# Patient Record
Sex: Female | Born: 1950 | Race: White | Hispanic: No | Marital: Married | Smoking: Former smoker
Health system: Southern US, Community
[De-identification: ages and names within clinical notes are randomized; demographics above are authoritative.]

## PROBLEM LIST (undated history)

## (undated) DIAGNOSIS — K635 Polyp of colon: Secondary | ICD-10-CM

## (undated) DIAGNOSIS — G4733 Obstructive sleep apnea (adult) (pediatric): Secondary | ICD-10-CM

## (undated) DIAGNOSIS — F32A Depression, unspecified: Secondary | ICD-10-CM

## (undated) DIAGNOSIS — Z8619 Personal history of other infectious and parasitic diseases: Secondary | ICD-10-CM

## (undated) DIAGNOSIS — M1990 Unspecified osteoarthritis, unspecified site: Secondary | ICD-10-CM

## (undated) DIAGNOSIS — C801 Malignant (primary) neoplasm, unspecified: Secondary | ICD-10-CM

## (undated) DIAGNOSIS — E785 Hyperlipidemia, unspecified: Secondary | ICD-10-CM

## (undated) DIAGNOSIS — R42 Dizziness and giddiness: Secondary | ICD-10-CM

## (undated) DIAGNOSIS — G4730 Sleep apnea, unspecified: Secondary | ICD-10-CM

## (undated) DIAGNOSIS — I10 Essential (primary) hypertension: Secondary | ICD-10-CM

## (undated) DIAGNOSIS — M4800 Spinal stenosis, site unspecified: Secondary | ICD-10-CM

## (undated) DIAGNOSIS — T7840XA Allergy, unspecified, initial encounter: Secondary | ICD-10-CM

## (undated) HISTORY — PX: TRIGGER FINGER RELEASE: SHX641

## (undated) HISTORY — DX: Allergy, unspecified, initial encounter: T78.40XA

## (undated) HISTORY — DX: Sleep apnea, unspecified: G47.30

## (undated) HISTORY — DX: Personal history of other infectious and parasitic diseases: Z86.19

## (undated) HISTORY — PX: TONSILLECTOMY: SUR1361

## (undated) HISTORY — DX: Hyperlipidemia, unspecified: E78.5

## (undated) HISTORY — PX: OVARIAN CYST REMOVAL: SHX89

## (undated) HISTORY — DX: Obstructive sleep apnea (adult) (pediatric): G47.33

## (undated) HISTORY — DX: Essential (primary) hypertension: I10

## (undated) HISTORY — DX: Polyp of colon: K63.5

## (undated) HISTORY — DX: Malignant (primary) neoplasm, unspecified: C80.1

## (undated) HISTORY — DX: Dizziness and giddiness: R42

## (undated) HISTORY — PX: COLONOSCOPY: SHX174

## (undated) HISTORY — PX: BREAST SURGERY: SHX581

## (undated) HISTORY — PX: OTHER SURGICAL HISTORY: SHX169

## (undated) HISTORY — PX: REDUCTION MAMMAPLASTY: SUR839

---

## 2001-07-17 HISTORY — PX: MASTECTOMY: SHX3

## 2001-07-17 HISTORY — PX: BREAST BIOPSY: SHX20

## 2012-01-31 DIAGNOSIS — E7800 Pure hypercholesterolemia, unspecified: Secondary | ICD-10-CM | POA: Insufficient documentation

## 2014-03-01 DIAGNOSIS — G4730 Sleep apnea, unspecified: Secondary | ICD-10-CM | POA: Insufficient documentation

## 2014-03-01 DIAGNOSIS — D126 Benign neoplasm of colon, unspecified: Secondary | ICD-10-CM | POA: Insufficient documentation

## 2014-03-01 DIAGNOSIS — G43909 Migraine, unspecified, not intractable, without status migrainosus: Secondary | ICD-10-CM | POA: Insufficient documentation

## 2014-03-01 DIAGNOSIS — R42 Dizziness and giddiness: Secondary | ICD-10-CM | POA: Insufficient documentation

## 2014-03-01 DIAGNOSIS — R7309 Other abnormal glucose: Secondary | ICD-10-CM | POA: Insufficient documentation

## 2014-03-01 DIAGNOSIS — J309 Allergic rhinitis, unspecified: Secondary | ICD-10-CM | POA: Insufficient documentation

## 2014-03-01 DIAGNOSIS — C50919 Malignant neoplasm of unspecified site of unspecified female breast: Secondary | ICD-10-CM | POA: Insufficient documentation

## 2014-03-01 DIAGNOSIS — G4733 Obstructive sleep apnea (adult) (pediatric): Secondary | ICD-10-CM | POA: Insufficient documentation

## 2014-03-01 DIAGNOSIS — G4700 Insomnia, unspecified: Secondary | ICD-10-CM | POA: Insufficient documentation

## 2014-03-01 DIAGNOSIS — D729 Disorder of white blood cells, unspecified: Secondary | ICD-10-CM | POA: Insufficient documentation

## 2014-11-19 DIAGNOSIS — Z803 Family history of malignant neoplasm of breast: Secondary | ICD-10-CM | POA: Insufficient documentation

## 2014-11-27 DIAGNOSIS — D0512 Intraductal carcinoma in situ of left breast: Secondary | ICD-10-CM | POA: Insufficient documentation

## 2015-06-09 LAB — HM COLONOSCOPY

## 2015-11-09 DIAGNOSIS — D126 Benign neoplasm of colon, unspecified: Secondary | ICD-10-CM | POA: Insufficient documentation

## 2016-03-02 LAB — HM PAP SMEAR: HM PAP: NORMAL

## 2017-09-17 DIAGNOSIS — F419 Anxiety disorder, unspecified: Secondary | ICD-10-CM | POA: Insufficient documentation

## 2017-09-17 DIAGNOSIS — F32A Depression, unspecified: Secondary | ICD-10-CM | POA: Insufficient documentation

## 2017-11-12 LAB — HM MAMMOGRAPHY

## 2018-02-25 DIAGNOSIS — L2084 Intrinsic (allergic) eczema: Secondary | ICD-10-CM | POA: Insufficient documentation

## 2018-05-28 ENCOUNTER — Ambulatory Visit (INDEPENDENT_AMBULATORY_CARE_PROVIDER_SITE_OTHER): Payer: BLUE CROSS/BLUE SHIELD | Admitting: Internal Medicine

## 2018-05-28 VITALS — BP 110/72 | HR 97 | Temp 98.4°F | Ht 64.0 in | Wt 197.6 lb

## 2018-05-28 DIAGNOSIS — Z86000 Personal history of in-situ neoplasm of breast: Secondary | ICD-10-CM

## 2018-05-28 DIAGNOSIS — G4733 Obstructive sleep apnea (adult) (pediatric): Secondary | ICD-10-CM

## 2018-05-28 DIAGNOSIS — R7303 Prediabetes: Secondary | ICD-10-CM | POA: Insufficient documentation

## 2018-05-28 DIAGNOSIS — E669 Obesity, unspecified: Secondary | ICD-10-CM

## 2018-05-28 DIAGNOSIS — Z1389 Encounter for screening for other disorder: Secondary | ICD-10-CM

## 2018-05-28 DIAGNOSIS — R42 Dizziness and giddiness: Secondary | ICD-10-CM

## 2018-05-28 DIAGNOSIS — E785 Hyperlipidemia, unspecified: Secondary | ICD-10-CM | POA: Diagnosis not present

## 2018-05-28 DIAGNOSIS — Z1159 Encounter for screening for other viral diseases: Secondary | ICD-10-CM

## 2018-05-28 DIAGNOSIS — Z13818 Encounter for screening for other digestive system disorders: Secondary | ICD-10-CM

## 2018-05-28 DIAGNOSIS — E559 Vitamin D deficiency, unspecified: Secondary | ICD-10-CM

## 2018-05-28 DIAGNOSIS — Z1329 Encounter for screening for other suspected endocrine disorder: Secondary | ICD-10-CM

## 2018-05-28 DIAGNOSIS — I10 Essential (primary) hypertension: Secondary | ICD-10-CM | POA: Insufficient documentation

## 2018-05-28 DIAGNOSIS — Z0184 Encounter for antibody response examination: Secondary | ICD-10-CM

## 2018-05-28 NOTE — Progress Notes (Signed)
Chief Complaint  Patient presents with  . Establish Care   New pt from [place]   1. 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 H/o OSA unable to tolerate cpap or dental piece in the past  3. H/o prediabetes A1C 5.9 8/14//19  4. HTN controlled/HLD TGs 165 02/27/18 on pravachol 10 mg qhs, lis-hctz 10-12.5 mg qd, norvasc 2.5 mg qd  5. H/o vertigo reviewed CT head and neck no sig CAS negative 02/15/18 takes prn meclizine CT head and neck neg 02/15/18    Review of Systems  Constitutional: Negative for weight loss.  HENT: Negative for hearing loss.   Eyes: Negative for blurred vision.  Respiratory: Negative for shortness of breath.   Cardiovascular: Negative for chest pain.  Gastrointestinal: Negative for abdominal pain.  Musculoskeletal: Negative for falls.  Skin: Negative for rash.  Neurological: Negative for dizziness.  Psychiatric/Behavioral: Negative for depression.   Past Medical History:  Diagnosis Date  . Allergy   . Cancer [facility])    left breast  DCIS cancer bx 2001/2002   . Colon polyps   . History of chicken pox   . Hyperlipidemia   . Hypertension   . OSA (obstructive sleep apnea)    not able to tolerate cpap or dental mouth piece   . Vertigo    CT neck and head negative significant CAS    Past Surgical History:  Procedure Laterality Date  . BREAST SURGERY     L mastectomy ~2001/2002 and reconstruction  . CESAREAN SECTION     x 3 1979, 1982, 1992   . tonsillectomy     Family History  Problem Relation Age of Onset  . Cancer Sister        breast cancer   . Cancer Cousin        m 1st cousin breast and pancreatic ca  . Cancer Cousin        breast m 1st cousin  . Cancer Cousin        m 1st cousin breast   . Cancer Cousin        m 1st cousin breast   . Cancer Mother        colon cancer    Social History   Socioeconomic History  . Marital status: Married    Spouse name: Not on file  . Number of children: Not on file  . Years of education: Not on file  . Highest education level: Not on file  Occupational History  . Not on file  Social Needs  . Financial resource strain: Not on file  . Food insecurity:    Worry: Not on file    Inability: Not on file  . Transportation needs:    Medical: Not on file    Non-medical: Not on file  Tobacco Use  . Smoking status: Former Research scientist (life sciences)  . Smokeless tobacco: Never Used  . Tobacco comment: former smoker x 3 years 19-22 light   Substance and Sexual Activity  . Alcohol use: Yes  . Drug use: Not Currently  . Sexual activity: Not Currently    Partners: Male  Lifestyle  . Physical activity:    Days per week: Not on file    Minutes per session: Not on file  . Stress: Not on file  Relationships  .  Social connections:    Talks on phone: Not on file    Gets together: Not on file    Attends religious service: Not on file    Active member of club or organization: Not on file    Attends meetings of clubs or organizations: Not on file    Relationship status: Not on file  . Intimate partner violence:    Fear of current or ex partner: Not on file    Emotionally abused: Not on file    Physically abused: Not on file    Forced sexual activity: Not on file  Other Topics Concern  . Not on file  Social History Narrative   Moved from [place] 2019    3 kids and grandkids    Statistician, retired    Married    No guns, wears seat belt safe in relationship    Current Meds  Medication Sig  . amLODipine (NORVASC) 2.5 MG tablet Take 2.5 mg by mouth daily.   Marland Kitchen aspirin 81 MG tablet Take 81 mg by mouth daily.  . chlorhexidine (PERIDEX) 0.12 % solution chlorhexidine gluconate 0.12 % mouthwash  . desloratadine (CLARINEX) 5 MG tablet desloratadine 5 mg tablet  . lisinopril-hydrochlorothiazide (PRINZIDE,ZESTORETIC) 10-12.5 MG tablet Take 2  tablets by mouth daily.   . meclizine (ANTIVERT) 25 MG tablet Take by mouth.  . meloxicam (MOBIC) 15 MG tablet meloxicam 15 mg tablet  . methocarbamol (ROBAXIN) 750 MG tablet methocarbamol 750 mg tablet  . Multiple Vitamin (MULTI-VITAMIN DAILY PO) Multi Vitamin  . Omega-3 Fatty Acids (FISH OIL PO) Take by mouth.  . pravastatin (PRAVACHOL) 10 MG tablet Take 10 mg by mouth at bedtime.   . triamcinolone cream (KENALOG) 0.1 % triamcinolone acetonide 0.1 % topical cream  . venlafaxine XR (EFFEXOR XR) 150 MG 24 hr capsule Take 150 mg by mouth daily with breakfast.    No Known Allergies No results found for this or any previous visit (from the past 2160 hour(s)). Objective  Body mass index is 33.92 kg/m. Wt Readings from Last 3 Encounters:  05/28/18 197 lb 9.6 oz (89.6 kg)   Temp Readings from Last 3 Encounters:  05/28/18 98.4 F (36.9 C)   BP Readings from Last 3 Encounters:  05/28/18 110/72   Pulse Readings from Last 3 Encounters:  05/28/18 97    Physical Exam  Constitutional: She is oriented to person, place, and time. Vital signs are normal. She appears well-developed and well-nourished. She is cooperative.  HENT:  Head: Normocephalic and atraumatic.  Mouth/Throat: Oropharynx is clear and moist and mucous membranes are normal.  Eyes: Pupils are equal, round, and reactive to light. Conjunctivae are normal.  Cardiovascular: Normal rate, regular rhythm and normal heart sounds.  Pulmonary/Chest: Effort normal and breath sounds normal.  Neurological: She is alert and oriented to person, place, and time. Gait normal.  Skin: Skin is warm, dry and intact.  Psychiatric: She has a normal mood and affect. Her speech is normal and behavior is normal. Judgment and thought content normal. Cognition and memory are normal.  Nursing note and vitals reviewed.   Assessment   1. 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 H/o OSA unable to tolerate cpap or dental piece  3. H/o prediabetes  4. HTN controlled/HLD TGs 165 02/27/18  5. H/o vertigo reviewed CT head and neck no sig CAS negative  02/15/18  6. HM/BMI 33.92  Plan   1. Pt will f/u with breast surgeon in [place]  2. Monitor  3. Check labs upcoming A1C was 5.9 02/15/18  4. Cont meds pravachol 10 mg qhs, lis-hctz 10-12.5 mg qd, norvasc 2.5 mg qd  Will want refill to [facility] in 06/2018 insurance changes of all meds pharmacy for now [facility]  5. Prn meclizine  6.  Flu shot had 03/14/18 Consider prevnar and pna 23 in future  Tdap had 09/05/10 ? If had in 2017 as well confirm with pt  shingrix 1/2 had 03/2018 2nd dose due 06/03/18 or before 6 months of 1st   Had labs CBC, A1C, lipid 02/2018 reviewed cbc normal A1C 5.9 Tgs 165 elevated.  mammo had 11/12/17 unilateral right 3d neg fibrograndular densities, 08/08/17 breast MRI benign Will need ob/gyn in 02/2019 disc Dr. Blima Rich [facility]  Colonoscopy 05/31/15 sessile serrated polyp per pt per GI f/u in 5 years. Mom + FH colon cancer  DEXA and pap requested Dr. Rexene Alberts [facility] [place] Derm appt 06/2018 Dr. Evorn Gong   PCP Lynnell Grain in 2019 in [place]  Breast cancer surgeon Dr. Real Cons in [place] wants to continue to follow  OB/GYN Dr. Rexene Alberts in [place]     Provider: Dr. Olivia Mackie McLean-Scocuzza-Internal Medicine

## 2018-05-28 NOTE — Patient Instructions (Addendum)
Dr. Blima Rich [facility] Ob[facility])-call me back when ready like 2-3 months before 02/2019  Dr. Georgianne Fick ([facility])  Call me back around and request [facility]2018    Cholesterol Cholesterol is a white, waxy, fat-like substance that is needed by the human body in small amounts. The liver makes all the cholesterol we need. Cholesterol is carried from the liver by the blood through the blood vessels. Deposits of cholesterol (plaques) may build up on blood vessel (artery) walls. Plaques make the arteries narrower and stiffer. Cholesterol plaques increase the risk for heart attack and stroke. You cannot feel your cholesterol level even if it is very high. The only way to know that it is high is to have a blood test. Once you know your cholesterol levels, you should keep a record of the test results. Work with your health care provider to keep your levels in the desired range. What do the results mean?  Total cholesterol is a rough measure of all the cholesterol in your blood.  LDL (low-density lipoprotein) is the "bad" cholesterol. This is the type that causes plaque to build up on the artery walls. You want this level to be low.  HDL (high-density lipoprotein) is the "good" cholesterol because it cleans the arteries and carries the LDL away. You want this level to be high.  Triglycerides are fat that the body can either burn for energy or store. High levels are closely linked to heart disease. What are the desired levels of cholesterol?  Total cholesterol below 200.  LDL below 100 for people who are at risk, below 70 for people at very high risk.  HDL above 40 is good. A level of 60 or higher is considered to be protective against heart disease.  Triglycerides below 150. How can I lower my cholesterol? Diet Follow your diet program as told by your health care provider.  Choose fish or white meat chicken and [place], roasted or baked. Limit  fatty cuts of red meat, fried foods, and processed meats, such as sausage and lunch meats.  Eat lots of fresh fruits and vegetables.  Choose whole grains, beans, pasta, potatoes, and cereals.  Choose olive oil, corn oil, or canola oil, and use only small amounts.  Avoid butter, mayonnaise, shortening, or palm kernel oils.  Avoid foods with trans fats.  Drink skim or nonfat milk and eat low-fat or nonfat yogurt and cheeses. Avoid whole milk, cream, ice cream, egg yolks, and full-fat cheeses.  Healthier desserts include angel food cake, ginger snaps, animal crackers, hard candy, popsicles, and low-fat or nonfat frozen yogurt. Avoid pastries, cakes, pies, and cookies.  Exercise  Follow your exercise program as told by your health care provider. A regular program: ? Helps to decrease LDL and raise HDL. ? Helps with weight control.  Do things that increase your activity level, such as gardening, walking, and taking the stairs.  Ask your health care provider about ways that you can be more active in your daily life.  Medicine  Take over-the-counter and prescription medicines only as told by your health care provider. ? Medicine may be prescribed by your health care provider to help lower cholesterol and decrease the risk for heart disease. This is usually done if diet and exercise have failed to bring down cholesterol levels. ? If you have several risk factors, you may need medicine even if your levels are normal.  This information is not intended to replace advice given to you  by your health care provider. Make sure you discuss any questions you have with your health care provider. Document Released: 03/28/2001 Document Revised: 01/29/2016 Document Reviewed: 01/01/2016 Elsevier Interactive Patient Education  [employer].

## 2018-05-28 NOTE — Progress Notes (Signed)
Pre visit review using our clinic review tool, if applicable. No additional management support is needed unless otherwise documented below in the visit note. 

## 2018-05-29 ENCOUNTER — Encounter: Payer: Self-pay | Admitting: Internal Medicine

## 2018-05-29 DIAGNOSIS — E669 Obesity, unspecified: Secondary | ICD-10-CM | POA: Insufficient documentation

## 2018-06-03 ENCOUNTER — Other Ambulatory Visit: Payer: BLUE CROSS/BLUE SHIELD

## 2018-06-05 ENCOUNTER — Other Ambulatory Visit (INDEPENDENT_AMBULATORY_CARE_PROVIDER_SITE_OTHER): Payer: BLUE CROSS/BLUE SHIELD

## 2018-06-05 ENCOUNTER — Ambulatory Visit (INDEPENDENT_AMBULATORY_CARE_PROVIDER_SITE_OTHER): Payer: BLUE CROSS/BLUE SHIELD | Admitting: *Deleted

## 2018-06-05 DIAGNOSIS — Z13818 Encounter for screening for other digestive system disorders: Secondary | ICD-10-CM

## 2018-06-05 DIAGNOSIS — Z1329 Encounter for screening for other suspected endocrine disorder: Secondary | ICD-10-CM | POA: Diagnosis not present

## 2018-06-05 DIAGNOSIS — E559 Vitamin D deficiency, unspecified: Secondary | ICD-10-CM

## 2018-06-05 DIAGNOSIS — I10 Essential (primary) hypertension: Secondary | ICD-10-CM

## 2018-06-05 DIAGNOSIS — Z1159 Encounter for screening for other viral diseases: Secondary | ICD-10-CM | POA: Diagnosis not present

## 2018-06-05 DIAGNOSIS — Z0184 Encounter for antibody response examination: Secondary | ICD-10-CM

## 2018-06-05 DIAGNOSIS — Z23 Encounter for immunization: Secondary | ICD-10-CM | POA: Diagnosis not present

## 2018-06-05 DIAGNOSIS — Z1389 Encounter for screening for other disorder: Secondary | ICD-10-CM

## 2018-06-05 LAB — COMPREHENSIVE METABOLIC PANEL
ALT: 25 U/L (ref 0–35)
AST: 21 U/L (ref 0–37)
Albumin: 4.3 g/dL (ref 3.5–5.2)
Alkaline Phosphatase: 54 U/L (ref 39–117)
BUN: 21 mg/dL (ref 6–23)
CHLORIDE: 101 meq/L (ref 96–112)
CO2: 30 meq/L (ref 19–32)
CREATININE: 0.82 mg/dL (ref 0.40–1.20)
Calcium: 9.8 mg/dL (ref 8.4–10.5)
GFR: 74.37 mL/min (ref >60.00)
GLUCOSE: 89 mg/dL (ref 70–99)
Potassium: 3.8 mEq/L (ref 3.5–5.1)
Sodium: 140 mEq/L (ref 135–145)
Total Bilirubin: 0.6 mg/dL (ref 0.2–1.2)
Total Protein: 7.1 g/dL (ref 6.0–8.3)

## 2018-06-05 LAB — VITAMIN D 25 HYDROXY (VIT D DEFICIENCY, FRACTURES): VITD: 24.5 ng/mL — ABNORMAL LOW (ref 30.00–100.00)

## 2018-06-05 LAB — TSH: TSH: 2.25 u[IU]/mL (ref 0.35–4.50)

## 2018-06-05 NOTE — Addendum Note (Signed)
Addended by: Leeanne Rio on: 06/05/2018 10:57 AM   Modules accepted: Orders

## 2018-06-06 LAB — URINALYSIS, ROUTINE W REFLEX MICROSCOPIC
BILIRUBIN UA: NEGATIVE
GLUCOSE, UA: NEGATIVE
NITRITE UA: NEGATIVE
PROTEIN UA: NEGATIVE
RBC UA: NEGATIVE
Specific Gravity, UA: 1.029 (ref 1.005–1.030)
Urobilinogen, Ur: 0.2 mg/dL (ref 0.2–1.0)
pH, UA: 5 (ref 5.0–7.5)

## 2018-06-06 LAB — MEASLES/MUMPS/RUBELLA IMMUNITY: Rubella: 15.3 index

## 2018-06-06 LAB — MICROSCOPIC EXAMINATION
Casts: NONE SEEN /lpf
Epithelial Cells (non renal): NONE SEEN /hpf (ref 0–10)

## 2018-06-06 LAB — HEPATITIS C ANTIBODY
HEP C AB: NONREACTIVE
SIGNAL TO CUT-OFF: 0.01 (ref <1.00)

## 2018-06-07 ENCOUNTER — Encounter: Payer: Self-pay | Admitting: Internal Medicine

## 2018-07-01 ENCOUNTER — Encounter: Payer: Self-pay | Admitting: Internal Medicine

## 2018-07-01 MED ORDER — LISINOPRIL-HYDROCHLOROTHIAZIDE 10-12.5 MG PO TABS: 2.0000 | ORAL_TABLET | Freq: Every day | ORAL | 1 refills | Status: DC

## 2018-07-02 ENCOUNTER — Other Ambulatory Visit: Payer: Self-pay | Admitting: Internal Medicine

## 2018-07-02 DIAGNOSIS — I10 Essential (primary) hypertension: Secondary | ICD-10-CM

## 2018-07-02 DIAGNOSIS — E785 Hyperlipidemia, unspecified: Secondary | ICD-10-CM

## 2018-07-02 MED ORDER — AMLODIPINE BESYLATE 2.5 MG PO TABS: 2.5000 mg | ORAL_TABLET | Freq: Every day | ORAL | 3 refills | Status: DC

## 2018-07-02 MED ORDER — PRAVASTATIN SODIUM 40 MG PO TABS: 40.0000 mg | ORAL_TABLET | Freq: Every day | ORAL | 3 refills | Status: DC

## 2018-07-15 ENCOUNTER — Telehealth: Payer: Self-pay | Admitting: Internal Medicine

## 2018-07-15 NOTE — Telephone Encounter (Signed)
Pt is having a MRI done on 08/08/17 in [place]. And needs to have A BUN and Creatine lab done while she is here  Please place lab orders

## 2018-07-15 NOTE — Telephone Encounter (Signed)
PCP nor Morgan ordered MRI. Provider who ordered the MRI will have to place order, Correct?

## 2018-07-15 NOTE — Telephone Encounter (Signed)
I will defer to the patients PCP.

## 2018-07-16 NOTE — Telephone Encounter (Signed)
Yes who ordered MRI?  Call and ask pt who ever ordered MRI will need to put these labs in Thanks [place]rtle

## 2018-07-16 NOTE — Telephone Encounter (Signed)
CT doctor sent in MRI.  Patient needs labs done a month before but she will not be in [place] until middle of January.  She is requesting the labs done here in [place] and results she will take back with her in [place], where she will have her MRI.

## 2018-07-16 NOTE — Telephone Encounter (Signed)
She can call the doctor who ordered CT/MRI to get lab order for [facility] order for [facility] and have then faxed back to ordering provider. We have several [facility] in [place]  I do not want to start ordering labs for another physician   [place]

## 2018-07-19 ENCOUNTER — Encounter: Payer: Self-pay | Admitting: Internal Medicine

## 2018-07-19 NOTE — Telephone Encounter (Signed)
Patient was informed.

## 2018-07-29 ENCOUNTER — Encounter: Payer: Self-pay | Admitting: Internal Medicine

## 2018-07-31 ENCOUNTER — Other Ambulatory Visit: Payer: Self-pay | Admitting: Internal Medicine

## 2018-07-31 ENCOUNTER — Encounter: Payer: Self-pay | Admitting: Internal Medicine

## 2018-07-31 DIAGNOSIS — I10 Essential (primary) hypertension: Secondary | ICD-10-CM

## 2018-07-31 DIAGNOSIS — F419 Anxiety disorder, unspecified: Secondary | ICD-10-CM | POA: Insufficient documentation

## 2018-07-31 DIAGNOSIS — R232 Flushing: Secondary | ICD-10-CM

## 2018-07-31 MED ORDER — LISINOPRIL-HYDROCHLOROTHIAZIDE 20-12.5 MG PO TABS: 1.0000 | ORAL_TABLET | Freq: Every day | ORAL | 3 refills | Status: DC

## 2018-07-31 MED ORDER — VENLAFAXINE HCL ER 150 MG PO CP24: 150.0000 mg | ORAL_CAPSULE | Freq: Every day | ORAL | 3 refills | Status: DC

## 2018-08-23 ENCOUNTER — Ambulatory Visit: Payer: Medicare Other | Admitting: Internal Medicine

## 2018-08-23 ENCOUNTER — Encounter: Payer: Self-pay | Admitting: Internal Medicine

## 2018-08-23 DIAGNOSIS — J101 Influenza due to other identified influenza virus with other respiratory manifestations: Secondary | ICD-10-CM | POA: Diagnosis not present

## 2018-08-23 MED ORDER — GUAIFENESIN-CODEINE 100-10 MG/5ML PO SYRP: 5.0000 mL | ORAL_SOLUTION | Freq: Three times a day (TID) | ORAL | 0 refills | Status: DC | PRN

## 2018-08-23 MED ORDER — PREDNISONE 10 MG PO TABS: ORAL_TABLET | ORAL | 0 refills | Status: DC

## 2018-08-23 MED ORDER — OSELTAMIVIR PHOSPHATE 75 MG PO CAPS: 75.0000 mg | ORAL_CAPSULE | Freq: Two times a day (BID) | ORAL | 0 refills | Status: DC

## 2018-08-23 NOTE — Progress Notes (Signed)
Subjective:  Patient ID: Tiffany Jarvis, female    DOB: 24-Feb-1951  Age: 68 y.o. MRN: [id number]  CC: The encounter diagnosis was Influenza due to influenza virus, type B.  HPI Tiffany Jarvis presents for evaluation and treatment of flu like illness.  Symptoms started 3 days ago with a mild sore throat and rhinorrhea,  followed by the development of body aches,sinus congestion ,  productive cough and fevers yesterday,  Accompanied by anorexia     She denies sinus/facial/ear pain.  No nausea, vomiting or diarrhea. Has mild headache without  neck pain .    Outpatient Medications Prior to Visit  Medication Sig Dispense Refill  . amLODipine (NORVASC) 2.5 MG tablet Take 1 tablet (2.5 mg total) by mouth daily. 90 tablet 3  . aspirin 81 MG tablet Take 81 mg by mouth daily.    . chlorhexidine (PERIDEX) 0.12 % solution chlorhexidine gluconate 0.12 % mouthwash    . desloratadine (CLARINEX) 5 MG tablet desloratadine 5 mg tablet    . lisinopril-hydrochlorothiazide (ZESTORETIC) 20-12.5 MG tablet Take 1 tablet by mouth daily. In am 90 tablet 3  . meclizine (ANTIVERT) 25 MG tablet Take by mouth.    . meloxicam (MOBIC) 15 MG tablet meloxicam 15 mg tablet    . methocarbamol (ROBAXIN) 750 MG tablet methocarbamol 750 mg tablet    . Multiple Vitamin (MULTI-VITAMIN DAILY PO) Multi Vitamin    . Omega-3 Fatty Acids (FISH OIL PO) Take by mouth.    . pravastatin (PRAVACHOL) 40 MG tablet Take 1 tablet (40 mg total) by mouth at bedtime. 90 tablet 3  . triamcinolone cream (KENALOG) 0.1 % triamcinolone acetonide 0.1 % topical cream    . venlafaxine XR (EFFEXOR XR) 150 MG 24 hr capsule Take 1 capsule (150 mg total) by mouth daily with breakfast. 90 capsule 3   No facility-administered medications prior to visit.     Review of Systems;  Patient denies , unintentional weight loss, skin rash, eye pain, sinus congestion and sinus pain,  dysphagia,  hemoptysis ,  dyspnea, wheezing, chest pain, palpitations, orthopnea,  edema, abdominal pain, nausea, melena, diarrhea, constipation, flank pain, dysuria, hematuria, urinary  Frequency, nocturia, numbness, tingling, seizures,  Focal weakness, Loss of consciousness,  Tremor, insomnia, depression, anxiety, and suicidal ideation.      Objective:  BP 118/84 (BP Location: Left Arm, Patient Position: Sitting, Cuff Size: Large)   Pulse 96   Temp 98.6 F (37 C) (Oral)   Resp 16   Ht 5\' 4"  (1.626 m)   Wt 198 lb 9.6 oz (90.1 kg)   SpO2 96%   BMI 34.09 kg/m   BP Readings from Last 3 Encounters:  08/23/18 118/84  05/28/18 110/72    Wt Readings from Last 3 Encounters:  08/23/18 198 lb 9.6 oz (90.1 kg)  05/28/18 197 lb 9.6 oz (89.6 kg)    General appearance: alert, cooperative and appears ill/ stated age Ears: normal TM's and external ear canals both ears Throat: lips, mucosa, and tongue normal; teeth and gums normal Neck: tender cervical  adenopathy, no carotid bruit, supple, symmetrical, trachea midlineand thyroid not enlar ged, symmetric, no tenderness/mass/nodules Back: symmetric, no curvature. ROM normal. No CVA tenderness. Lungs: clear to auscultation bilaterally Heart: regular rate and rhythm, S1, S2 normal, no murmur, click, rub or gallop Abdomen: soft, non-tender; bowel sounds normal; no masses,  no organomegaly Pulses: 2+ and symmetric Skin: Skin color, texture, turgor normal. No rashes or lesions Lymph nodes: Cervical, supraclavicular, and axillary nodes normal.  No results found for: HGBA1C  Lab Results  Component Value Date   CREATININE 0.82 06/05/2018    Lab Results  Component Value Date   GLUCOSE 89 06/05/2018   ALT 25 06/05/2018   AST 21 06/05/2018   NA 140 06/05/2018   K 3.8 06/05/2018   CL 101 06/05/2018   CREATININE 0.82 06/05/2018   BUN 21 06/05/2018   CO2 30 06/05/2018   TSH 2.25 06/05/2018    Patient was never admitted.  Assessment & Plan:   Problem List Items Addressed This Visit    Influenza due to influenza  virus, type B    Prescribing  tamiflu  To start  Tonight,  Continue tylenol and motrin for body aches.  Adding Prednisone taper and cough suppressant  manage cough and sinus congestion .       Relevant Medications   oseltamivir (TAMIFLU) 75 MG capsule      I am having [name] start on oseltamivir, guaiFENesin-codeine, and predniSONE. I am also having her maintain her Multiple Vitamin (MULTI-VITAMIN DAILY PO), chlorhexidine, desloratadine, meclizine, meloxicam, methocarbamol, triamcinolone cream, aspirin, Omega-3 Fatty Acids (FISH OIL PO), amLODipine, pravastatin, lisinopril-hydrochlorothiazide, and venlafaxine XR.  Meds ordered this encounter  Medications  . oseltamivir (TAMIFLU) 75 MG capsule    Sig: Take 1 capsule (75 mg total) by mouth 2 (two) times daily.    Dispense:  10 capsule    Refill:  0  . guaiFENesin-codeine (CHERATUSSIN AC) 100-10 MG/5ML syrup    Sig: Take 5 mLs by mouth 3 (three) times daily as needed for cough.    Dispense:  120 mL    Refill:  0  . predniSONE (DELTASONE) 10 MG tablet    Sig: 6 tablets on Day 1 , then reduce by 1 tablet daily until gone    Dispense:  21 tablet    Refill:  0    There are no discontinued medications.  Follow-up: No follow-ups on file.   Crecencio Mc, MD

## 2018-08-23 NOTE — Patient Instructions (Addendum)
YOU HAVE INFLUENZA B  I recommend that you start taking Tamiflu tonight and continue it twice daily for 5 days  I am also prescribing a prednisone taper to help the cough and body aches:  6 tablets all at once IN THE MORNING  on Day 1,  Then 5 tablets on Day 2,  4 tablets on Day 3,  Etc ( continue to reduce dose by  1 tablet daily until gone)  cheratussin with codeine for severe cough  You can still use tylenol up to 2000 mg daily for body aches and fever   Rest,  Fluids,  Rest fluids!!    Influenza, Adult Influenza, more commonly known as "the flu," is a viral infection that mainly affects the respiratory tract. The respiratory tract includes organs that help you breathe, such as the lungs, nose, and throat. The flu causes many symptoms similar to the common cold along with high fever and body aches. The flu spreads easily from person to person (is contagious). Getting a flu shot (influenza vaccination) every year is the best way to prevent the flu. What are the causes? This condition is caused by the influenza virus. You can get the virus by:  Breathing in droplets that are in the air from an infected person's cough or sneeze.  Touching something that has been exposed to the virus (has been contaminated) and then touching your mouth, nose, or eyes. What increases the risk? The following factors may make you more likely to get the flu:  Not washing or sanitizing your hands often.  Having close contact with many people during cold and flu season.  Touching your mouth, eyes, or nose without first washing or sanitizing your hands.  Not getting a yearly (annual) flu shot. You may have a higher risk for the flu, including serious problems such as a lung infection (pneumonia), if you:  Are older than 65.  Are pregnant.  Have a weakened disease-fighting system (immune system). You may have a weakened immune system if you: ? Have HIV or AIDS. ? Are undergoing chemotherapy. ? Are  taking medicines that reduce (suppress) the activity of your immune system.  Have a long-term (chronic) illness, such as heart disease, kidney disease, diabetes, or lung disease.  Have a liver disorder.  Are severely overweight (morbidly obese).  Have anemia. This is a condition that affects your red blood cells.  Have asthma. What are the signs or symptoms? Symptoms of this condition usually begin suddenly and last 4-14 days. They may include:  Fever and chills.  Headaches, body aches, or muscle aches.  Sore throat.  Cough.  Runny or stuffy (congested) nose.  Chest discomfort.  Poor appetite.  Weakness or fatigue.  Dizziness.  Nausea or vomiting. How is this diagnosed? This condition may be diagnosed based on:  Your symptoms and medical history.  A physical exam.  Swabbing your nose or throat and testing the fluid for the influenza virus. How is this treated? If the flu is diagnosed early, you can be treated with medicine that can help reduce how severe the illness is and how long it lasts (antiviral medicine). This may be given by mouth (orally) or through an IV. Taking care of yourself at home can help relieve symptoms. Your health care provider may recommend:  Taking over-the-counter medicines.  Drinking plenty of fluids. In many cases, the flu goes away on its own. If you have severe symptoms or complications, you may be treated in a hospital. Follow these instructions  at home: Activity  Rest as needed and get plenty of sleep.  Stay home from work or school as told by your health care provider. Unless you are visiting your health care provider, avoid leaving home until your fever has been gone for 24 hours without taking medicine. Eating and drinking  Take an oral rehydration solution (ORS). This is a drink that is sold at pharmacies and retail stores.  Drink enough fluid to keep your urine pale yellow.  Drink clear fluids in small amounts as you are  able. Clear fluids include water, ice chips, diluted fruit juice, and low-calorie sports drinks.  Eat bland, easy-to-digest foods in small amounts as you are able. These foods include bananas, applesauce, rice, lean meats, toast, and crackers.  Avoid drinking fluids that contain a lot of sugar or caffeine, such as energy drinks, regular sports drinks, and soda.  Avoid alcohol.  Avoid spicy or fatty foods. General instructions      Take over-the-counter and prescription medicines only as told by your health care provider.  Use a cool mist humidifier to add humidity to the air in your home. This can make it easier to breathe.  Cover your mouth and nose when you cough or sneeze.  Wash your hands with soap and water often, especially after you cough or sneeze. If soap and water are not available, use alcohol-based hand sanitizer.  Keep all follow-up visits as told by your health care provider. This is important. How is this prevented?   Get an annual flu shot. You may get the flu shot in late summer, fall, or winter. Ask your health care provider when you should get your flu shot.  Avoid contact with people who are sick during cold and flu season. This is generally fall and winter. Contact a health care provider if:  You develop new symptoms.  You have: ? Chest pain. ? Diarrhea. ? A fever.  Your cough gets worse.  You produce more mucus.  You feel nauseous or you vomit. Get help right away if:  You develop shortness of breath or difficulty breathing.  Your skin or nails turn a bluish color.  You have severe pain or stiffness in your neck.  You develop a sudden headache or sudden pain in your face or ear.  You cannot eat or drink without vomiting. Summary  Influenza, more commonly known as "the flu," is a viral infection that primarily affects your respiratory tract.  Symptoms of the flu usually begin suddenly and last 4-14 days.  Getting an annual flu shot is  the best way to prevent getting the flu.  Stay home from work or school as told by your health care provider. Unless you are visiting your health care provider, avoid leaving home until your fever has been gone for 24 hours without taking medicine.  Keep all follow-up visits as told by your health care provider. This is important. This information is not intended to replace advice given to you by your health care provider. Make sure you discuss any questions you have with your health care provider. Document Released: 06/30/2000 Document Revised: 12/19/2017 Document Reviewed: 12/19/2017 Elsevier Interactive Patient Education  2019 [employer].

## 2018-08-25 ENCOUNTER — Encounter: Payer: Self-pay | Admitting: Internal Medicine

## 2018-08-25 DIAGNOSIS — J101 Influenza due to other identified influenza virus with other respiratory manifestations: Secondary | ICD-10-CM

## 2018-08-25 HISTORY — DX: Influenza due to other identified influenza virus with other respiratory manifestations: J10.1

## 2018-08-25 NOTE — Assessment & Plan Note (Signed)
Prescribing  tamiflu  To start  Tonight,  Continue tylenol and motrin for body aches.  Adding Prednisone taper and cough suppressant  manage cough and sinus congestion .

## 2018-08-27 ENCOUNTER — Encounter: Payer: Self-pay | Admitting: Internal Medicine

## 2018-08-27 ENCOUNTER — Other Ambulatory Visit: Payer: Self-pay | Admitting: Internal Medicine

## 2018-08-27 ENCOUNTER — Telehealth: Payer: Self-pay

## 2018-08-27 DIAGNOSIS — I10 Essential (primary) hypertension: Secondary | ICD-10-CM

## 2018-08-27 MED ORDER — LISINOPRIL-HYDROCHLOROTHIAZIDE 20-12.5 MG PO TABS: 2.0000 | ORAL_TABLET | Freq: Every day | ORAL | 3 refills | Status: DC

## 2018-08-27 NOTE — Telephone Encounter (Signed)
I'm sorry but I cannot accept her at this time  I am too full

## 2018-08-27 NOTE — Telephone Encounter (Signed)
Copied from [place]. Topic: Appointment Scheduling - Transfer of Care >> Aug 27, 2018 12:26 PM Margot Ables wrote: Pt is requesting to transfer FROM: Dr. Terese Door Pt is requesting to transfer TO: Dr. Derrel Nip Reason for requested transfer: see mychart msg from pt - please call her to advise.  Send CRM to patient's current PCP (transferring FROM).

## 2018-08-27 NOTE — Telephone Encounter (Signed)
I advised patient that she could possibly switch to Mable Paris, FNP who works under Dr. Derrel Nip since she excepting patients. Patient said that she would consider.

## 2018-08-28 ENCOUNTER — Other Ambulatory Visit: Payer: Self-pay | Admitting: Internal Medicine

## 2018-08-28 ENCOUNTER — Ambulatory Visit: Payer: BLUE CROSS/BLUE SHIELD | Admitting: Internal Medicine

## 2018-08-28 DIAGNOSIS — R232 Flushing: Secondary | ICD-10-CM

## 2018-09-08 ENCOUNTER — Encounter: Payer: Self-pay | Admitting: Internal Medicine

## 2018-09-19 ENCOUNTER — Other Ambulatory Visit: Payer: Self-pay

## 2018-09-19 ENCOUNTER — Encounter: Payer: Self-pay | Admitting: Internal Medicine

## 2018-09-19 DIAGNOSIS — I10 Essential (primary) hypertension: Secondary | ICD-10-CM

## 2018-09-19 MED ORDER — LISINOPRIL-HYDROCHLOROTHIAZIDE 20-12.5 MG PO TABS: 2.0000 | ORAL_TABLET | Freq: Every day | ORAL | 3 refills | Status: DC

## 2018-09-24 ENCOUNTER — Other Ambulatory Visit: Payer: Self-pay

## 2018-09-24 DIAGNOSIS — I10 Essential (primary) hypertension: Secondary | ICD-10-CM

## 2018-09-24 MED ORDER — LISINOPRIL-HYDROCHLOROTHIAZIDE 20-12.5 MG PO TABS: 2.0000 | ORAL_TABLET | Freq: Every day | ORAL | 3 refills | Status: DC

## 2019-01-08 ENCOUNTER — Other Ambulatory Visit: Payer: Self-pay

## 2019-01-08 DIAGNOSIS — Z1231 Encounter for screening mammogram for malignant neoplasm of breast: Secondary | ICD-10-CM

## 2019-01-30 DIAGNOSIS — G4733 Obstructive sleep apnea (adult) (pediatric): Secondary | ICD-10-CM

## 2019-02-03 NOTE — Telephone Encounter (Signed)
De for mouthpiece printed

## 2019-02-04 NOTE — Telephone Encounter (Signed)
DME order has been mailed.

## 2019-02-24 ENCOUNTER — Encounter: Payer: Self-pay | Admitting: Radiology

## 2019-02-24 ENCOUNTER — Ambulatory Visit
Admission: RE | Admit: 2019-02-24 | Discharge: 2019-02-24 | Disposition: A | Discharge status: Home / Self Care | Payer: Medicare Other | Source: Ambulatory Visit | Attending: General Surgery | Admitting: General Surgery

## 2019-02-24 DIAGNOSIS — Z1231 Encounter for screening mammogram for malignant neoplasm of breast: Secondary | ICD-10-CM | POA: Diagnosis present

## 2019-02-26 NOTE — Telephone Encounter (Signed)
LMTCB

## 2019-02-28 ENCOUNTER — Other Ambulatory Visit: Payer: Self-pay

## 2019-03-03 ENCOUNTER — Other Ambulatory Visit: Payer: Self-pay

## 2019-03-04 ENCOUNTER — Other Ambulatory Visit: Payer: Self-pay | Admitting: *Deleted

## 2019-03-04 ENCOUNTER — Other Ambulatory Visit: Payer: Self-pay

## 2019-03-04 ENCOUNTER — Encounter: Payer: Self-pay | Admitting: Radiation Oncology

## 2019-03-04 ENCOUNTER — Ambulatory Visit: Payer: BLUE CROSS/BLUE SHIELD | Admitting: General Surgery

## 2019-03-04 ENCOUNTER — Ambulatory Visit
Admission: RE | Admit: 2019-03-04 | Discharge: 2019-03-04 | Disposition: A | Discharge status: Home / Self Care | Payer: Medicare Other | Source: Ambulatory Visit | Attending: Radiation Oncology | Admitting: Radiation Oncology

## 2019-03-04 VITALS — BP 154/87 | HR 85 | Temp 96.0°F | Resp 18 | Wt 206.1 lb

## 2019-03-04 DIAGNOSIS — E785 Hyperlipidemia, unspecified: Secondary | ICD-10-CM | POA: Diagnosis not present

## 2019-03-04 DIAGNOSIS — Z803 Family history of malignant neoplasm of breast: Secondary | ICD-10-CM | POA: Diagnosis not present

## 2019-03-04 DIAGNOSIS — Z86010 Personal history of colonic polyps: Secondary | ICD-10-CM | POA: Diagnosis not present

## 2019-03-04 DIAGNOSIS — Z86000 Personal history of in-situ neoplasm of breast: Secondary | ICD-10-CM

## 2019-03-04 DIAGNOSIS — Z87891 Personal history of nicotine dependence: Secondary | ICD-10-CM | POA: Insufficient documentation

## 2019-03-04 DIAGNOSIS — R42 Dizziness and giddiness: Secondary | ICD-10-CM | POA: Diagnosis not present

## 2019-03-04 DIAGNOSIS — Z7982 Long term (current) use of aspirin: Secondary | ICD-10-CM | POA: Diagnosis not present

## 2019-03-04 DIAGNOSIS — Z9012 Acquired absence of left breast and nipple: Secondary | ICD-10-CM | POA: Insufficient documentation

## 2019-03-04 DIAGNOSIS — Z79899 Other long term (current) drug therapy: Secondary | ICD-10-CM | POA: Insufficient documentation

## 2019-03-04 DIAGNOSIS — I10 Essential (primary) hypertension: Secondary | ICD-10-CM | POA: Diagnosis not present

## 2019-03-04 NOTE — Consult Note (Signed)
NEW PATIENT EVALUATION  Name: Tiffany Jarvis  MRN: [id number]  Date:   03/04/2019     DOB: Sep 09, 1950   This 68 y.o. 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 Breast Cancer    Initial consultation to continue follow up and monitoring of breast cancer    DIAGNOSIS: The encounter diagnosis was History of ductal carcinoma in situ (DCIS) of breast.   PREVIOUS INVESTIGATIONS:  Clinical records reviewed Pathology report reviewed Mammograms reviewed  HPI: Patient is a 68 year old female recently moved to this area from [place].  She is status post a left modified radical mastectomy with residual breast tissue remaining for ductal carcinoma in situ.  Patient declined antiestrogen therapy.  She has been having MRI scans of her breasts every 6 months up in [place].  She recently had a mammogram in our department which was BI-RADS 1 benign.  She specifically denies breast tenderness cough or bone pain.  She is otherwise doing well.  She is seen today to establish follow-up care.  PLANNED TREATMENT REGIMEN: No current treatment  PAST MEDICAL HISTORY:  has a past medical history of Allergy, Cancer ([place]), Colon polyps, History of chicken pox, Hyperlipidemia, Hypertension, OSA (obstructive sleep apnea), and Vertigo.    PAST SURGICAL HISTORY:  Past Surgical History:  Procedure Laterality Date  . BREAST BIOPSY Left 2003   positive  . BREAST SURGERY     L mastectomy ~2001/2002 and reconstruction  . CESAREAN SECTION     x 3 1979, 1982, 1992   . MASTECTOMY Left 2003  . REDUCTION MAMMAPLASTY Right   . tonsillectomy      FAMILY HISTORY: family history includes Breast cancer in her cousin, cousin, cousin, cousin, and sister; Cancer in her cousin,  cousin, cousin, cousin, mother, and sister.  SOCIAL HISTORY:  reports that she has quit smoking. She has never used smokeless tobacco. She reports current alcohol use. She reports previous drug use.  ALLERGIES: Patient has no known allergies.  MEDICATIONS:  Current Outpatient Medications  Medication Sig Dispense Refill  . amLODipine (NORVASC) 2.5 MG tablet Take 1 tablet (2.5 mg total) by mouth daily. 90 tablet 3  . aspirin 81 MG tablet Take 81 mg by mouth daily.    . chlorhexidine (PERIDEX) 0.12 % solution chlorhexidine gluconate 0.12 % mouthwash    . desloratadine (CLARINEX) 5 MG tablet desloratadine 5 mg tablet    . guaiFENesin-codeine (CHERATUSSIN AC) 100-10 MG/5ML syrup Take 5 mLs by mouth 3 (three) times daily as needed for cough. 120 mL 0  . lisinopril-hydrochlorothiazide (ZESTORETIC) 20-12.5 MG tablet Take 2 tablets by mouth daily. In am 180 tablet 3  . meclizine (ANTIVERT) 25 MG tablet Take by mouth.    . meloxicam (MOBIC) 15 MG tablet meloxicam 15 mg tablet    . methocarbamol (ROBAXIN) 750 MG tablet methocarbamol 750 mg tablet    . Multiple Vitamin (MULTI-VITAMIN DAILY PO) Multi Vitamin    . Omega-3 Fatty Acids (FISH OIL PO) Take by mouth.    . oseltamivir (TAMIFLU) 75 MG capsule Take 1 capsule (75 mg total) by mouth 2 (two) times daily. 10 capsule 0  . pravastatin (PRAVACHOL) 40 MG tablet Take 1 tablet (40 mg total) by mouth at bedtime. 90 tablet 3  . predniSONE (DELTASONE)  10 MG tablet 6 tablets on Day 1 , then reduce by 1 tablet daily until gone 21 tablet 0  . triamcinolone cream (KENALOG) 0.1 % triamcinolone acetonide 0.1 % topical cream    . venlafaxine XR (EFFEXOR XR) 150 MG 24 hr capsule Take 1 capsule (150 mg total) by mouth daily with breakfast. 90 capsule 3   No current facility-administered medications for this encounter.     ECOG PERFORMANCE STATUS:  0 - Asymptomatic  REVIEW OF SYSTEMS: Patient denies any weight loss, fatigue, weakness, fever, chills or night  sweats. Patient denies any loss of vision, blurred vision. Patient denies any ringing  of the ears or hearing loss. No irregular heartbeat. Patient denies heart murmur or history of fainting. Patient denies any chest pain or pain radiating to her upper extremities. Patient denies any shortness of breath, difficulty breathing at night, cough or hemoptysis. Patient denies any swelling in the lower legs. Patient denies any nausea vomiting, vomiting of blood, or coffee ground material in the vomitus. Patient denies any stomach pain. Patient states has had normal bowel movements no significant constipation or diarrhea. Patient denies any dysuria, hematuria or significant nocturia. Patient denies any problems walking, swelling in the joints or loss of balance. Patient denies any skin changes, loss of hair or loss of weight. Patient denies any excessive worrying or anxiety or significant depression. Patient denies any problems with insomnia. Patient denies excessive thirst, polyuria, polydipsia. Patient denies any swollen glands, patient denies easy bruising or easy bleeding. Patient denies any recent infections, allergies or URI. Patient "s visual fields have not changed significantly in recent time.   PHYSICAL EXAM: BP (!) 154/87 (BP Location: Left Arm, Patient Position: Sitting)   Pulse 85   Temp (!) 96 F (35.6 C) (Tympanic)   Resp 18   Wt 206 lb 1.6 oz (93.5 kg)   BMI 35.38 kg/m  Patient is status post left modified radical mastectomy with breast reconstruction which is fair.  No dominant mass or nodularity is noted in either breast in 2 positions examined.  No axillary or supraclavicular adenopathy is identified.  Well-developed well-nourished patient in NAD. HEENT reveals PERLA, EOMI, discs not visualized.  Oral cavity is clear. No oral mucosal lesions are identified. Neck is clear without evidence of cervical or supraclavicular adenopathy. Lungs are clear to A&P. Cardiac examination is essentially  unremarkable with regular rate and rhythm without murmur rub or thrill. Abdomen is benign with no organomegaly or masses noted. Motor sensory and DTR levels are equal and symmetric in the upper and lower extremities. Cranial nerves II through XII are grossly intact. Proprioception is intact. No peripheral adenopathy or edema is identified. No motor or sensory levels are noted. Crude visual fields are within normal range.  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  I have explained to her I do not believe she needs MRI scans every 6 months and in fact diagnostic mammograms would be fine in a yearly basis at this time.  Patient comprehends my reasoning and recommendations.  I have set her up for 1 year follow-up.  She knows to call at  anytime with any concerns.  I would like to take this opportunity to thank you for allowing me to participate in the care of your patient.Noreene Filbert, MD

## 2019-03-05 ENCOUNTER — Other Ambulatory Visit: Payer: Medicare Other

## 2019-03-05 ENCOUNTER — Telehealth: Payer: Self-pay

## 2019-03-05 ENCOUNTER — Other Ambulatory Visit
Admission: RE | Admit: 2019-03-05 | Discharge: 2019-03-05 | Disposition: A | Discharge status: Home / Self Care | Payer: Medicare Other | Source: Ambulatory Visit | Attending: Internal Medicine | Admitting: Internal Medicine

## 2019-03-05 DIAGNOSIS — I10 Essential (primary) hypertension: Secondary | ICD-10-CM

## 2019-03-05 DIAGNOSIS — E785 Hyperlipidemia, unspecified: Secondary | ICD-10-CM | POA: Diagnosis present

## 2019-03-05 DIAGNOSIS — R5383 Other fatigue: Secondary | ICD-10-CM | POA: Insufficient documentation

## 2019-03-05 DIAGNOSIS — Z1329 Encounter for screening for other suspected endocrine disorder: Secondary | ICD-10-CM | POA: Diagnosis not present

## 2019-03-05 DIAGNOSIS — R7303 Prediabetes: Secondary | ICD-10-CM | POA: Diagnosis not present

## 2019-03-05 LAB — COMPREHENSIVE METABOLIC PANEL
ALT: 34 U/L (ref 0–44)
AST: 29 U/L (ref 15–41)
Albumin: 4 g/dL (ref 3.5–5.0)
Alkaline Phosphatase: 54 U/L (ref 38–126)
Anion gap: 13 (ref 5–15)
BUN: 19 mg/dL (ref 8–23)
CO2: 24 mmol/L (ref 22–32)
Calcium: 9.4 mg/dL (ref 8.9–10.3)
Chloride: 102 mmol/L (ref 98–111)
Creatinine, Ser: 0.64 mg/dL (ref 0.44–1.00)
GFR calc Af Amer: >60 mL/min (ref >60)
GFR calc non Af Amer: >60 mL/min (ref >60)
Glucose, Bld: 111 mg/dL — ABNORMAL HIGH (ref 70–99)
Potassium: 3.9 mmol/L (ref 3.5–5.1)
Sodium: 139 mmol/L (ref 135–145)
Total Bilirubin: 0.9 mg/dL (ref 0.3–1.2)
Total Protein: 7.4 g/dL (ref 6.5–8.1)

## 2019-03-05 LAB — CBC WITH DIFFERENTIAL/PLATELET
Abs Immature Granulocytes: 0.02 10*3/uL (ref 0.00–0.07)
Basophils Absolute: 0.1 10*3/uL (ref 0.0–0.1)
Basophils Relative: 1 %
Eosinophils Absolute: 0.1 10*3/uL (ref 0.0–0.5)
Eosinophils Relative: 1 %
HCT: 40.7 % (ref 36.0–46.0)
Hemoglobin: 14.1 g/dL (ref 12.0–15.0)
Immature Granulocytes: 0 %
Lymphocytes Relative: 44 %
Lymphs Abs: 3.4 10*3/uL (ref 0.7–4.0)
MCH: 29.9 pg (ref 26.0–34.0)
MCHC: 34.6 g/dL (ref 30.0–36.0)
MCV: 86.4 fL (ref 80.0–100.0)
Monocytes Absolute: 0.5 10*3/uL (ref 0.1–1.0)
Monocytes Relative: 7 %
Neutro Abs: 3.7 10*3/uL (ref 1.7–7.7)
Neutrophils Relative %: 47 %
Platelets: 217 10*3/uL (ref 150–400)
RBC: 4.71 MIL/uL (ref 3.87–5.11)
RDW: 12.1 % (ref 11.5–15.5)
WBC: 7.8 10*3/uL (ref 4.0–10.5)
nRBC: 0 % (ref 0.0–0.2)

## 2019-03-05 LAB — LIPID PANEL
Cholesterol: 211 mg/dL — ABNORMAL HIGH (ref 0–200)
HDL: 64 mg/dL (ref >40)
LDL Cholesterol: 113 mg/dL — ABNORMAL HIGH (ref 0–99)
Total CHOL/HDL Ratio: 3.3 RATIO
Triglycerides: 171 mg/dL — ABNORMAL HIGH (ref <150)
VLDL: 34 mg/dL (ref 0–40)

## 2019-03-05 LAB — TSH: TSH: 1.782 u[IU]/mL (ref 0.350–4.500)

## 2019-03-05 LAB — HEMOGLOBIN A1C
Hgb A1c MFr Bld: 5.9 % — ABNORMAL HIGH (ref 4.8–5.6)
Mean Plasma Glucose: 122.63 mg/dL

## 2019-03-05 NOTE — Telephone Encounter (Signed)
Labs were entered for pt to have fasting lab work done at the hospital lab.

## 2019-03-06 ENCOUNTER — Other Ambulatory Visit: Payer: Self-pay

## 2019-03-06 ENCOUNTER — Encounter: Payer: Self-pay | Admitting: Internal Medicine

## 2019-03-06 ENCOUNTER — Ambulatory Visit (INDEPENDENT_AMBULATORY_CARE_PROVIDER_SITE_OTHER): Payer: Medicare Other | Admitting: Internal Medicine

## 2019-03-06 VITALS — BP 128/84 | HR 74 | Temp 97.0°F | Resp 16 | Ht 64.0 in | Wt 207.6 lb

## 2019-03-06 DIAGNOSIS — R7303 Prediabetes: Secondary | ICD-10-CM

## 2019-03-06 DIAGNOSIS — Z780 Asymptomatic menopausal state: Secondary | ICD-10-CM

## 2019-03-06 DIAGNOSIS — Z23 Encounter for immunization: Secondary | ICD-10-CM

## 2019-03-06 DIAGNOSIS — I10 Essential (primary) hypertension: Secondary | ICD-10-CM | POA: Diagnosis not present

## 2019-03-06 DIAGNOSIS — Z0000 Encounter for general adult medical examination without abnormal findings: Secondary | ICD-10-CM | POA: Diagnosis not present

## 2019-03-06 DIAGNOSIS — G4733 Obstructive sleep apnea (adult) (pediatric): Secondary | ICD-10-CM

## 2019-03-06 NOTE — Patient Instructions (Addendum)
Low bread options:  Bagel Thins Sola bread (food lion) Joseph's pita and lavash bread (taste better toasted with seasons!)  You might want to try a premixed protein drink called Premier Protein shake in the morning.  It is less $$$ and very low sugar.  Other options include Atkins ,  EAS Advantage, and Muscle Milk   160 cal  30 g protein  1 g sugar 50% calcium needs    Frittatas  Are great breakfast choices (Homemade better than [employer]'s)  I'll order your Pneumonia vaccine and your DEXA scan when you return   Health Maintenance After Age 62 After age 73, you are at a higher risk for certain long-term diseases and infections as well as injuries from falls. Falls are a major cause of broken bones and head injuries in people who are older than age 20. Getting regular preventive care can help to keep you healthy and well. Preventive care includes getting regular testing and making lifestyle changes as recommended by your health care provider. Talk with your health care provider about:  Which screenings and tests you should have. A screening is a test that checks for a disease when you have no symptoms.  A diet and exercise plan that is right for you. What should I know about screenings and tests to prevent falls? Screening and testing are the best ways to find a health problem early. Early diagnosis and treatment give you the best chance of managing medical conditions that are common after age 39. Certain conditions and lifestyle choices may make you more likely to have a fall. Your health care provider may recommend:  Regular vision checks. Poor vision and conditions such as cataracts can make you more likely to have a fall. If you wear glasses, make sure to get your prescription updated if your vision changes.  Medicine review. Work with your health care provider to regularly review all of the medicines you are taking, including over-the-counter medicines. Ask your health care provider  about any side effects that may make you more likely to have a fall. Tell your health care provider if any medicines that you take make you feel dizzy or sleepy.  Osteoporosis screening. Osteoporosis is a condition that causes the bones to get weaker. This can make the bones weak and cause them to break more easily.  Blood pressure screening. Blood pressure changes and medicines to control blood pressure can make you feel dizzy.  Strength and balance checks. Your health care provider may recommend certain tests to check your strength and balance while standing, walking, or changing positions.  Foot health exam. Foot pain and numbness, as well as not wearing proper footwear, can make you more likely to have a fall.  Depression screening. You may be more likely to have a fall if you have a fear of falling, feel emotionally low, or feel unable to do activities that you used to do.  Alcohol use screening. Using too much alcohol can affect your balance and may make you more likely to have a fall. What actions can I take to lower my risk of falls? General instructions  Talk with your health care provider about your risks for falling. Tell your health care provider if: ? You fall. Be sure to tell your health care provider about all falls, even ones that seem minor. ? You feel dizzy, sleepy, or off-balance.  Take over-the-counter and prescription medicines only as told by your health care provider. These include any supplements.  Eat  a healthy diet and maintain a healthy weight. A healthy diet includes low-fat dairy products, low-fat (lean) meats, and fiber from whole grains, beans, and lots of fruits and vegetables. Home safety  Remove any tripping hazards, such as rugs, cords, and clutter.  Install safety equipment such as grab bars in bathrooms and safety rails on stairs.  Keep rooms and walkways well-lit. Activity   Follow a regular exercise program to stay fit. This will help you  maintain your balance. Ask your health care provider what types of exercise are appropriate for you.  If you need a cane or walker, use it as recommended by your health care provider.  Wear supportive shoes that have nonskid soles. Lifestyle  Do not drink alcohol if your health care provider tells you not to drink.  If you drink alcohol, limit how much you have: ? 0-1 drink a day for women. ? 0-2 drinks a day for men.  Be aware of how much alcohol is in your drink. In the [place] one drink equals one typical bottle of beer (12 oz), one-half glass of wine (5 oz), or one shot of hard liquor (1 oz).  Do not use any products that contain nicotine or tobacco, such as cigarettes and e-cigarettes. If you need help quitting, ask your health care provider. Summary  Having a healthy lifestyle and getting preventive care can help to protect your health and wellness after age 52.  Screening and testing are the best way to find a health problem early and help you avoid having a fall. Early diagnosis and treatment give you the best chance for managing medical conditions that are more common for people who are older than age 57.  Falls are a major cause of broken bones and head injuries in people who are older than age 55. Take precautions to prevent a fall at home.  Work with your health care provider to learn what changes you can make to improve your health and wellness and to prevent falls. This information is not intended to replace advice given to you by your health care provider. Make sure you discuss any questions you have with your health care provider. Document Released: 05/16/2017 Document Revised: 10/24/2018 Document Reviewed: 05/16/2017 Elsevier Patient Education  2020 [employer].

## 2019-03-06 NOTE — Progress Notes (Signed)
Patient ID: Tiffany Jarvis, female    DOB: [birth year]  Age: 68 y.o. MRN: [id number]  The patient is here for annual comprehensive examination and management of other chronic and acute problems.    Seeing Dr Garwin Brothers for PAP annual mAMMOGRAM august 10  with Chrystal for DCIS . Left mastectomy  Last PAP smear July 2019 Wants prevnar Corie Chiquito  Going to [place] on Saturday to help son with schooling his children bc of epidemic  eye exam within the last year   No cataracts  Last colonoscopy Nov 2016 CT Ed Blalock  5 yr  Due  maternal hx of  colon CA at age 54  .  Started screening at 78  Quit over 40 yrs ago   Needs DEXA  Takes 2 fiber pills bid and   The risk factors are reflected in the social history.  The roster of all physicians providing medical care to patient - is listed in the Snapshot section of the chart.  Activities of daily living:  The patient is 100% independent in all ADLs: dressing, toileting, feeding as well as independent mobility  Home safety : The patient has smoke detectors in the home. They wear seatbelts.  There are no firearms at home. There is no violence in the home.   There is no risks for hepatitis, STDs or HIV. There is no   history of blood transfusion. They have no travel history to infectious disease endemic areas of the world.  The patient has seen their dentist in the last six month. They have seen their eye doctor in the last year. They admit to slight hearing difficulty with regard to whispered voices and some television programs.  They have deferred audiologic testing in the last year.  They do not  have excessive sun exposure. Discussed the need for sun protection: hats, long sleeves and use of sunscreen if there is significant sun exposure.   Diet: the importance of a healthy diet is discussed. They do have a healthy diet.  The benefits of regular aerobic exercise were discussed. She walks 4 times per week ,  20 minutes.   Depression screen: there are no  signs or vegative symptoms of depression- irritability, change in appetite, anhedonia, sadness/tearfullness.  Cognitive assessment: the patient manages all their financial and personal affairs and is actively engaged. They could relate day,date,year and events; recalled 2/3 objects at 3 minutes; performed clock-face test normally.  The following portions of the patient's history were reviewed and updated as appropriate: allergies, current medications, past family history, past medical history,  past surgical history, past social history  and problem list.  Visual acuity was not assessed per patient preference since she has regular follow up with her ophthalmologist. Hearing and body mass index were assessed and reviewed.   During the course of the visit the patient was educated and counseled about appropriate screening and preventive services including : fall prevention , diabetes screening, nutrition counseling, colorectal cancer screening, and recommended immunizations.    CC: Diagnoses of Need for immunization against influenza, Prediabetes, Essential hypertension, OSA (obstructive sleep apnea), and Encounter for preventive health examination were pertinent to this visit.  Cc:  She has been having trouble losing weight .  Low GI diet and regular participation in exercise discussed at length    Hypertension: patient checks blood pressure twice weekly at home.  Readings have been for the most part < 140/80 at rest . Patient is following a reduce salt diet most days and is taking  medications as prescribed  History Jasmane has a past medical history of Allergy, Cancer ([place]), Colon polyps, History of chicken pox, Hyperlipidemia, Hypertension, OSA (obstructive sleep apnea), and Vertigo.   She has a past surgical history that includes tonsillectomy; Cesarean section; Breast surgery; Mastectomy (Left, 2003); Breast biopsy (Left, 2003); and Reduction mammaplasty (Right).   Her family history includes  Breast cancer in her cousin, cousin, cousin, cousin, and sister; Cancer in her cousin, cousin, cousin, cousin, mother, and sister.She reports that she has quit smoking. She has never used smokeless tobacco. She reports current alcohol use. She reports previous drug use.  Outpatient Medications Prior to Visit  Medication Sig Dispense Refill  . amLODipine (NORVASC) 2.5 MG tablet Take 1 tablet (2.5 mg total) by mouth daily. 90 tablet 3  . aspirin 81 MG tablet Take 81 mg by mouth daily.    Marland Kitchen lisinopril-hydrochlorothiazide (ZESTORETIC) 20-12.5 MG tablet Take 2 tablets by mouth daily. In am 180 tablet 3  . meclizine (ANTIVERT) 25 MG tablet Take by mouth.    . meloxicam (MOBIC) 15 MG tablet meloxicam 15 mg tablet    . Multiple Vitamin (MULTI-VITAMIN DAILY PO) Multi Vitamin    . Omega-3 Fatty Acids (FISH OIL PO) Take by mouth.    . pravastatin (PRAVACHOL) 40 MG tablet Take 1 tablet (40 mg total) by mouth at bedtime. 90 tablet 3  . venlafaxine XR (EFFEXOR XR) 150 MG 24 hr capsule Take 1 capsule (150 mg total) by mouth daily with breakfast. 90 capsule 3  . chlorhexidine (PERIDEX) 0.12 % solution chlorhexidine gluconate 0.12 % mouthwash    . desloratadine (CLARINEX) 5 MG tablet desloratadine 5 mg tablet    . guaiFENesin-codeine (CHERATUSSIN AC) 100-10 MG/5ML syrup Take 5 mLs by mouth 3 (three) times daily as needed for cough. (Patient not taking: Reported on 03/06/2019) 120 mL 0  . methocarbamol (ROBAXIN) 750 MG tablet methocarbamol 750 mg tablet    . oseltamivir (TAMIFLU) 75 MG capsule Take 1 capsule (75 mg total) by mouth 2 (two) times daily. (Patient not taking: Reported on 03/06/2019) 10 capsule 0  . predniSONE (DELTASONE) 10 MG tablet 6 tablets on Day 1 , then reduce by 1 tablet daily until gone (Patient not taking: Reported on 03/06/2019) 21 tablet 0  . triamcinolone cream (KENALOG) 0.1 % triamcinolone acetonide 0.1 % topical cream     No facility-administered medications prior to visit.     Review  of Systems   Patient denies headache, fevers, malaise, unintentional weight loss, skin rash, eye pain, sinus congestion and sinus pain, sore throat, dysphagia,  hemoptysis , cough, dyspnea, wheezing, chest pain, palpitations, orthopnea, edema, abdominal pain, nausea, melena, diarrhea, constipation, flank pain, dysuria, hematuria, urinary  Frequency, nocturia, numbness, tingling, seizures,  Focal weakness, Loss of consciousness,  Tremor, insomnia, depression, anxiety, and suicidal ideation.      Objective:  BP 128/84 (BP Location: Left Arm, Patient Position: Sitting, Cuff Size: Large)   Pulse 74   Temp (!) 97 F (36.1 C) (Oral)   Resp 16   Ht 5\' 4"  (1.626 m)   Wt 207 lb 9.6 oz (94.2 kg)   SpO2 97%   BMI 35.63 kg/m   Physical Exam   General appearance: alert, cooperative and appears stated age Head: Normocephalic, without obvious abnormality, atraumatic Eyes: conjunctivae/corneas clear. PERRL, EOM's intact. Fundi benign. Ears: normal TM's and external ear canals both ears Nose: Nares normal. Septum midline. Mucosa normal. No drainage or sinus tenderness. Throat: lips, mucosa, and tongue normal; teeth  and gums normal Neck: no adenopathy, no carotid bruit, no JVD, supple, symmetrical, trachea midline and thyroid not enlarged, symmetric, no tenderness/mass/nodules Lungs: clear to auscultation bilaterally Breasts: normal appearance, no masses or tenderness Heart: regular rate and rhythm, S1, S2 normal, no murmur, click, rub or gallop Abdomen: soft, non-tender; bowel sounds normal; no masses,  no organomegaly Extremities: extremities normal, atraumatic, no cyanosis or edema Pulses: 2+ and symmetric Skin: Skin color, texture, turgor normal. No rashes or lesions Neurologic: Alert and oriented X 3, normal strength and tone. Normal symmetric reflexes. Normal coordination and gait.      Assessment & Plan:   Problem List Items Addressed This Visit      Unprioritized   Prediabetes     Her  random glucose is not  elevated but her A1c suggests she is at risk for developing diabetes.  I recommended that  she follow a low glycemic index diet and particpate regularly in an aerobic  exercise activity.  We should check an A1c in 6 months.        OSA (obstructive sleep apnea)    diagnosed with prior sleep study but treatment has been deferred d by patient.  Discussed the long term history of OSA , the risks of long term damage to heart and the signs and symptoms attributable to OSA.  Advised patient to consider  significant weight loss and/or use of CPAP.       HTN (hypertension)    Well controlled on current regimen. Renal function stable, no changes today.      Encounter for preventive health examination    age appropriate education and counseling updated, referrals for preventative services and immunizations addressed, dietary and smoking counseling addressed, most recent labs reviewed.  I have personally reviewed and have noted:  1) the patient's medical and social history 2) The pt's use of alcohol, tobacco, and illicit drugs 3) The patient's current medications and supplements 4) Functional ability including ADL's, fall risk, home safety risk, hearing and visual impairment 5) Diet and physical activities 6) Evidence for depression or mood disorder 7) The patient's height, weight, and BMI have been recorded in the chart  I have made referrals, and provided counseling and education based on review of the above       Other Visit Diagnoses    Need for immunization against influenza       Relevant Orders   Flu Vaccine QUAD High Dose(Fluad) (Completed)      I have discontinued Lira Alessio's chlorhexidine, desloratadine, methocarbamol, triamcinolone cream, oseltamivir, guaiFENesin-codeine, and predniSONE. I am also having her maintain her Multiple Vitamin (MULTI-VITAMIN DAILY PO), meclizine, meloxicam, aspirin, Omega-3 Fatty Acids (FISH OIL PO), amLODipine,  pravastatin, venlafaxine XR, and lisinopril-hydrochlorothiazide.  No orders of the defined types were placed in this encounter.   Medications Discontinued During This Encounter  Medication Reason  . chlorhexidine (PERIDEX) 0.12 % solution Patient has not taken in last 30 days  . desloratadine (CLARINEX) 5 MG tablet Patient has not taken in last 30 days  . guaiFENesin-codeine (CHERATUSSIN AC) 100-10 MG/5ML syrup Patient has not taken in last 30 days  . methocarbamol (ROBAXIN) 750 MG tablet Patient has not taken in last 30 days  . oseltamivir (TAMIFLU) 75 MG capsule Patient has not taken in last 30 days  . predniSONE (DELTASONE) 10 MG tablet Patient has not taken in last 30 days  . triamcinolone cream (KENALOG) 0.1 % Patient has not taken in last 30 days    Follow-up: No follow-ups  on file.   Crecencio Mc, MD

## 2019-03-09 DIAGNOSIS — Z0000 Encounter for general adult medical examination without abnormal findings: Secondary | ICD-10-CM | POA: Insufficient documentation

## 2019-03-09 NOTE — Assessment & Plan Note (Signed)
diagnosed with prior sleep study but treatment has been deferred d by patient.  Discussed the long term history of OSA , the risks of long term damage to heart and the signs and symptoms attributable to OSA.  Advised patient to consider  significant weight loss and/or use of CPAP.  

## 2019-03-09 NOTE — Assessment & Plan Note (Signed)

## 2019-03-09 NOTE — Assessment & Plan Note (Signed)
Well controlled on current regimen. Renal function stable, no changes today. 

## 2019-03-09 NOTE — Assessment & Plan Note (Signed)
Her  random glucose is not  elevated but her A1c suggests she is at risk for developing diabetes.  I recommended that  she follow a low glycemic index diet and particpate regularly in an aerobic  exercise activity.  We should check an A1c in 6 months.

## 2019-03-28 ENCOUNTER — Encounter: Payer: Self-pay | Admitting: Internal Medicine

## 2019-04-18 ENCOUNTER — Encounter: Payer: BLUE CROSS/BLUE SHIELD | Admitting: Internal Medicine

## 2019-05-06 ENCOUNTER — Encounter: Payer: Self-pay | Admitting: *Deleted

## 2019-05-16 NOTE — Telephone Encounter (Signed)
PATIENT IS  due for the prevnar vaccine once you turn 67.  NEED TO LET FRONT  office know that SHE returned from [place] because there may be a mandatory period before which you cannot be seen in the office to protect the staff from any asymptomatic COVID [place],   Deborra Medina, MD   .

## 2019-05-27 ENCOUNTER — Ambulatory Visit (INDEPENDENT_AMBULATORY_CARE_PROVIDER_SITE_OTHER): Payer: Medicare Other | Admitting: *Deleted

## 2019-05-27 ENCOUNTER — Ambulatory Visit: Payer: Medicare Other

## 2019-05-27 ENCOUNTER — Other Ambulatory Visit: Payer: Self-pay

## 2019-05-27 DIAGNOSIS — Z23 Encounter for immunization: Secondary | ICD-10-CM

## 2019-06-05 ENCOUNTER — Ambulatory Visit
Admission: RE | Admit: 2019-06-05 | Discharge: 2019-06-05 | Disposition: A | Discharge status: Home / Self Care | Payer: Medicare Other | Source: Ambulatory Visit | Attending: Internal Medicine | Admitting: Internal Medicine

## 2019-06-05 DIAGNOSIS — Z780 Asymptomatic menopausal state: Secondary | ICD-10-CM | POA: Diagnosis present

## 2019-06-27 ENCOUNTER — Telehealth: Payer: Self-pay | Admitting: Internal Medicine

## 2019-06-27 ENCOUNTER — Other Ambulatory Visit: Payer: Self-pay

## 2019-06-27 DIAGNOSIS — E785 Hyperlipidemia, unspecified: Secondary | ICD-10-CM

## 2019-06-27 MED ORDER — PRAVASTATIN SODIUM 40 MG PO TABS: 40.0000 mg | ORAL_TABLET | Freq: Every day | ORAL | 3 refills | Status: DC

## 2019-06-27 NOTE — Telephone Encounter (Signed)
Pt has an appt on 07/04/2019 for a pap and pelvic exam. Dr. Derrel Nip knows her patients better then I do. If she passes screening does Tullo want to see her in office.

## 2019-06-27 NOTE — Telephone Encounter (Signed)
Yes I will see her in the office if she passes screening

## 2019-06-30 ENCOUNTER — Telehealth: Payer: Self-pay | Admitting: Internal Medicine

## 2019-06-30 ENCOUNTER — Other Ambulatory Visit: Payer: Self-pay

## 2019-07-04 ENCOUNTER — Ambulatory Visit (INDEPENDENT_AMBULATORY_CARE_PROVIDER_SITE_OTHER): Payer: Medicare Other | Admitting: Internal Medicine

## 2019-07-04 ENCOUNTER — Encounter: Payer: Self-pay | Admitting: Internal Medicine

## 2019-07-04 ENCOUNTER — Other Ambulatory Visit: Payer: Self-pay

## 2019-07-04 VITALS — BP 122/84 | HR 89 | Temp 95.9°F | Resp 16 | Ht 64.0 in | Wt 203.8 lb

## 2019-07-04 DIAGNOSIS — I10 Essential (primary) hypertension: Secondary | ICD-10-CM | POA: Diagnosis not present

## 2019-07-04 DIAGNOSIS — R7303 Prediabetes: Secondary | ICD-10-CM | POA: Diagnosis not present

## 2019-07-04 DIAGNOSIS — E669 Obesity, unspecified: Secondary | ICD-10-CM

## 2019-07-04 DIAGNOSIS — Z124 Encounter for screening for malignant neoplasm of cervix: Secondary | ICD-10-CM

## 2019-07-04 DIAGNOSIS — E559 Vitamin D deficiency, unspecified: Secondary | ICD-10-CM

## 2019-07-04 DIAGNOSIS — G4763 Sleep related bruxism: Secondary | ICD-10-CM

## 2019-07-04 NOTE — Patient Instructions (Addendum)
[employer]  For authentic [place] take out food [employer] operational officer style) In  [place]  Has the best steaks this side  Of [place]!    Congrats on the lifestyle changes!  We'll repeat fasting labs in February

## 2019-07-04 NOTE — Progress Notes (Signed)
Subjective:  Patient ID: Tiffany Jarvis, female    DOB: [birth year]  Age: 68 y.o. MRN: [id number]  CC: The primary encounter diagnosis was Cervical cancer screening. Diagnoses of Vitamin D deficiency, Essential hypertension, Prediabetes, Obesity (BMI 30-39.9), and Bruxism, sleep-related were also pertinent to this visit.  HPI Tiffany Jarvis presents for follow up on prediabetes and obesity  This visit occurred during the SARS-CoV-2 public health emergency.  Safety protocols were in place, including screening questions prior to the visit, additional usage of staff PPE, and extensive cleaning of exam room while observing appropriate contact time as indicated for disinfecting solutions.     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   Has been referred to retina specialist in [place].    Vision was 20/50.  Also told she had stage 2 cataracts   Left mastectomy,  Right screening mammogram was normal august  2020  DEXA normal Nov 2020   Bruxism.  Saw dentist yesterday for a bite guard . Told she had  Old fillings,  Lots of mercury   OSA diagnosed years ago ,  Couldn't tolerate mask had a mouthpiece made,  No longer fits   Outpatient Medications Prior to Visit  Medication Sig Dispense Refill  . amLODipine (NORVASC) 2.5 MG tablet Take 1 tablet (2.5 mg total) by mouth daily. 90 tablet 3  . aspirin 81 MG tablet Take 81 mg by mouth daily.    . Calcium Carbonate-Vit D-Min (CALCIUM 1200 PO) Take 1 tablet by mouth daily.     Marland Kitchen lisinopril-hydrochlorothiazide (ZESTORETIC) 20-12.5 MG tablet Take 2 tablets by mouth daily. In am 180 tablet 3  . meclizine (ANTIVERT) 25 MG tablet Take by mouth.    . meloxicam (MOBIC) 15 MG tablet meloxicam 15 mg tablet    . Multiple Vitamin (MULTI-VITAMIN DAILY PO) Multi Vitamin    . Omega-3 Fatty Acids (FISH OIL PO) Take by mouth.    . pravastatin (PRAVACHOL) 40 MG tablet Take 1 tablet (40 mg total) by mouth at  bedtime. 90 tablet 3  . venlafaxine XR (EFFEXOR XR) 150 MG 24 hr capsule Take 1 capsule (150 mg total) by mouth daily with breakfast. 90 capsule 3   No facility-administered medications prior to visit.    Review of Systems;  Patient denies headache, fevers, malaise, unintentional weight loss, skin rash, eye pain, sinus congestion and sinus pain, sore throat, dysphagia,  hemoptysis , cough, dyspnea, wheezing, chest pain, palpitations, orthopnea, edema, abdominal pain, nausea, melena, diarrhea, constipation, flank pain, dysuria, hematuria, urinary  Frequency, nocturia, numbness, tingling, seizures,  Focal weakness, Loss of consciousness,  Tremor, insomnia, depression, anxiety, and suicidal ideation.      Objective:  BP 122/84 (BP Location: Left Arm, Patient Position: Sitting, Cuff Size: Large)   Pulse 89   Temp (!) 95.9 F (35.5 C) (Temporal)   Resp 16   Ht 5\' 4"  (1.626 m)   Wt 203 lb 12.8 oz (92.4 kg)   SpO2 95%   BMI 34.98 kg/m   BP Readings from Last 3 Encounters:  07/04/19 122/84  03/06/19 128/84  03/04/19 (!) 154/87    Wt Readings from Last 3 Encounters:  07/04/19 203 lb 12.8 oz (92.4 kg)  03/06/19 207 lb 9.6 oz (94.2 kg)  03/04/19 206 lb 1.6 oz (93.5 kg)    General appearance: alert, cooperative and appears stated age Ears: normal TM's and external ear canals both ears Throat: lips,  mucosa, and tongue normal; teeth and gums normal Neck: no adenopathy, no carotid bruit, supple, symmetrical, trachea midline and thyroid not enlarged, symmetric, no tenderness/mass/nodules Back: symmetric, no curvature. ROM normal. No CVA tenderness. Lungs: clear to auscultation bilaterally Heart: regular rate and rhythm, S1, S2 normal, no murmur, click, rub or gallop Abdomen: soft, non-tender; bowel sounds normal; no masses,  no organomegaly Pulses: 2+ and symmetric Skin: Skin color, texture, turgor normal. No rashes or lesions Lymph nodes: Cervical, supraclavicular, and axillary nodes  normal.  Lab Results  Component Value Date   HGBA1C 5.9 (H) 03/05/2019    Lab Results  Component Value Date   CREATININE 0.64 03/05/2019   CREATININE 0.82 06/05/2018    Lab Results  Component Value Date   WBC 7.8 03/05/2019   HGB 14.1 03/05/2019   HCT 40.7 03/05/2019   PLT 217 03/05/2019   GLUCOSE 111 (H) 03/05/2019   CHOL 211 (H) 03/05/2019   TRIG 171 (H) 03/05/2019   HDL 64 03/05/2019   LDLCALC 113 (H) 03/05/2019   ALT 34 03/05/2019   AST 29 03/05/2019   NA 139 03/05/2019   K 3.9 03/05/2019   CL 102 03/05/2019   CREATININE 0.64 03/05/2019   BUN 19 03/05/2019   CO2 24 03/05/2019   TSH 1.782 03/05/2019   HGBA1C 5.9 (H) 03/05/2019    DG Bone Density  Result Date: 06/05/2019 EXAM: DUAL X-RAY ABSORPTIOMETRY (DXA) FOR BONE MINERAL DENSITY IMPRESSION: Technologist: Marquette Old Your patient Tiffany Jarvis completed a BMD test on 06/05/2019 using the [place] (analysis version: 14.10) manufactured by [employer]. The following summarizes the results of our evaluation. PATIENT BIOGRAPHICAL: Name: Tiffany Jarvis Patient ID: [id number] Birth Date: 1950-12-28 Height: 63.5 in. Gender: Female Exam Date: 06/05/2019 Weight: 200.1 lbs. Indications: Caucasian, History of Breast Cancer, Postmenopausal Fractures: Treatments: Vitamin D ASSESSMENT: The BMD measured at Femur Neck Right is 1.083 g/cm2 with a T-score of 0.3. This patient is considered normal according to [facility]) criteria. The scan quality is good. Site Region Measured Measured WHO Young Adult BMD Date       Age      Classification T-score AP Spine L1-L4 06/05/2019 65.9 Normal 2.0 1.440 g/cm2 DualFemur Neck Right 06/05/2019 65.9 Normal 0.3 1.083 g/cm2 World Health Organization [facility]) criteria for post-menopausal, Caucasian Women: Normal:       T-score at or above -1 SD Osteopenia:   T-score between -1 and -2.5 SD Osteoporosis: T-score at or below -2.5 SD RECOMMENDATIONS: 1. All patients should optimize calcium  and vitamin D intake. 2. Consider FDA-approved medical therapies in postmenopausal women and men aged 27 years and older, based on the following: a. A hip or vertebral(clinical or morphometric) fracture b. T-score < -2.5 at the femoral neck or spine after appropriate evaluation to exclude secondary causes c. Low bone mass (T-score between -1.0 and -2.5 at the femoral neck or spine) and a 10-year probability of a hip fracture > 3% or a 10-year probability of a major osteoporosis-related fracture > 20% based on the [place]-adapted WHO algorithm d. Clinician judgment and/or patient preferences may indicate treatment for people with 10-year fracture probabilities above or below these levels FOLLOW-UP: People with diagnosed cases of osteoporosis or at high risk for fracture should have regular bone mineral density tests. For patients eligible for Medicare, routine testing is allowed once every 2 years. The testing frequency can be increased to one year for patients who have rapidly progressing disease, those who are receiving or discontinuing medical  therapy to restore bone mass, or have additional risk factors. I have reviewed this report, and agree with the above findings. [facility] Radiology Electronically Signed   By: Lowella Grip III M.D.   On: 06/05/2019 10:42    Assessment & Plan:   Problem List Items Addressed This Visit      Unprioritized   Bruxism, sleep-related    Awaiting a bite guard from a mail order source       HTN (hypertension)   Relevant Orders   Comprehensive metabolic panel   Lipid panel   Obesity (BMI 30-39.9)    I have congratulated her in er weight loss and encouraged  Continued weight loss with goal of 10% of body weigh over the next 6 months using a low glycemic index diet and regular exercise a minimum of 5 days per week.        Prediabetes    Her  random glucose is not  elevated but her A1c suggests she is at risk for developing diabetes.  Diet and exercise history ,  role of both in preventing progression to diabetes  discussed in detail       Relevant Orders   Hemoglobin A1c    Other Visit Diagnoses    Cervical cancer screening    -  Primary   Vitamin D deficiency       Relevant Orders   Vitamin D 25 hydroxy      I am having [name] maintain her Multiple Vitamin (MULTI-VITAMIN DAILY PO), meclizine, meloxicam, aspirin, Omega-3 Fatty Acids (FISH OIL PO), amLODipine, venlafaxine XR, lisinopril-hydrochlorothiazide, pravastatin, and Calcium Carbonate-Vit D-Min (CALCIUM 1200 PO).  No orders of the defined types were placed in this encounter.   A total of 25 minutes of face to face time was spent with patient more than half of which was spent in counselling about the above mentioned conditions  and coordination of care  There are no discontinued medications.  Follow-up: No follow-ups on file.   Crecencio Mc, MD

## 2019-07-06 DIAGNOSIS — G4763 Sleep related bruxism: Secondary | ICD-10-CM | POA: Insufficient documentation

## 2019-07-06 DIAGNOSIS — I10 Essential (primary) hypertension: Secondary | ICD-10-CM

## 2019-07-06 DIAGNOSIS — R232 Flushing: Secondary | ICD-10-CM

## 2019-07-06 NOTE — Assessment & Plan Note (Signed)
Her  random glucose is not  elevated but her A1c suggests she is at risk for developing diabetes.  Diet and exercise history , role of both in preventing progression to diabetes  discussed in detail

## 2019-07-06 NOTE — Assessment & Plan Note (Signed)
I have congratulated her in er weight loss and encouraged  Continued weight loss with goal of 10% of body weigh over the next 6 months using a low glycemic index diet and regular exercise a minimum of 5 days per week.

## 2019-07-06 NOTE — Assessment & Plan Note (Signed)
Awaiting a bite guard from a mail order source

## 2019-07-08 MED ORDER — AMLODIPINE BESYLATE 2.5 MG PO TABS: 2.5000 mg | ORAL_TABLET | Freq: Every day | ORAL | 3 refills | Status: DC

## 2019-07-08 MED ORDER — LISINOPRIL-HYDROCHLOROTHIAZIDE 20-12.5 MG PO TABS: 2.0000 | ORAL_TABLET | Freq: Every day | ORAL | 3 refills | Status: DC

## 2019-07-08 MED ORDER — VENLAFAXINE HCL ER 150 MG PO CP24: 150.0000 mg | ORAL_CAPSULE | Freq: Every day | ORAL | 3 refills | Status: DC

## 2019-07-18 HISTORY — PX: EYE SURGERY: SHX253

## 2019-08-14 ENCOUNTER — Encounter: Payer: Self-pay | Admitting: *Deleted

## 2019-08-15 ENCOUNTER — Ambulatory Visit: Payer: Medicare Other

## 2019-08-29 ENCOUNTER — Other Ambulatory Visit: Payer: Self-pay

## 2019-09-03 ENCOUNTER — Other Ambulatory Visit: Payer: Self-pay

## 2019-09-03 ENCOUNTER — Other Ambulatory Visit (INDEPENDENT_AMBULATORY_CARE_PROVIDER_SITE_OTHER): Payer: Medicare Other

## 2019-09-03 DIAGNOSIS — R7303 Prediabetes: Secondary | ICD-10-CM

## 2019-09-03 DIAGNOSIS — I10 Essential (primary) hypertension: Secondary | ICD-10-CM

## 2019-09-03 DIAGNOSIS — E559 Vitamin D deficiency, unspecified: Secondary | ICD-10-CM

## 2019-09-03 NOTE — Addendum Note (Signed)
Addended by: Leeanne Rio on: 09/03/2019 12:55 PM   Modules accepted: Orders

## 2019-09-03 NOTE — Telephone Encounter (Signed)
Spoke with pt and lab appt has been moved to today.

## 2019-09-04 ENCOUNTER — Other Ambulatory Visit: Payer: Medicare Other

## 2019-09-04 LAB — LIPID PANEL
Cholesterol: 205 mg/dL — ABNORMAL HIGH (ref <200)
HDL: 61 mg/dL (ref >=50)
LDL Cholesterol (Calc): 110 mg/dL (calc) — ABNORMAL HIGH
Non-HDL Cholesterol (Calc): 144 mg/dL (calc) — ABNORMAL HIGH (ref <130)
Total CHOL/HDL Ratio: 3.4 (calc) (ref <5.0)
Triglycerides: 215 mg/dL — ABNORMAL HIGH (ref <150)

## 2019-09-04 LAB — COMPREHENSIVE METABOLIC PANEL
AG Ratio: 1.7 (calc) (ref 1.0–2.5)
ALT: 30 U/L — ABNORMAL HIGH (ref 6–29)
AST: 23 U/L (ref 10–35)
Albumin: 4.3 g/dL (ref 3.6–5.1)
Alkaline phosphatase (APISO): 61 U/L (ref 37–153)
BUN/Creatinine Ratio: 30 (calc) — ABNORMAL HIGH (ref 6–22)
BUN: 27 mg/dL — ABNORMAL HIGH (ref 7–25)
CO2: 26 mmol/L (ref 20–32)
Calcium: 10 mg/dL (ref 8.6–10.4)
Chloride: 102 mmol/L (ref 98–110)
Creat: 0.9 mg/dL (ref 0.50–0.99)
Globulin: 2.6 g/dL (calc) (ref 1.9–3.7)
Glucose, Bld: 107 mg/dL — ABNORMAL HIGH (ref 65–99)
Potassium: 4.1 mmol/L (ref 3.5–5.3)
Sodium: 140 mmol/L (ref 135–146)
Total Bilirubin: 0.5 mg/dL (ref 0.2–1.2)
Total Protein: 6.9 g/dL (ref 6.1–8.1)

## 2019-09-04 LAB — HEMOGLOBIN A1C
Hgb A1c MFr Bld: 6 % of total Hgb — ABNORMAL HIGH (ref <5.7)
Mean Plasma Glucose: 126 (calc)
eAG (mmol/L): 7 (calc)

## 2019-09-04 LAB — VITAMIN D 25 HYDROXY (VIT D DEFICIENCY, FRACTURES): Vit D, 25-Hydroxy: 36 ng/mL (ref 30–100)

## 2019-09-08 ENCOUNTER — Telehealth: Payer: Medicare Other | Admitting: Internal Medicine

## 2019-09-10 ENCOUNTER — Other Ambulatory Visit: Payer: Self-pay

## 2019-09-10 ENCOUNTER — Ambulatory Visit (INDEPENDENT_AMBULATORY_CARE_PROVIDER_SITE_OTHER): Payer: Medicare Other

## 2019-09-10 VITALS — Ht 64.0 in | Wt 203.0 lb

## 2019-09-10 DIAGNOSIS — Z0000 Encounter for general adult medical examination without abnormal findings: Secondary | ICD-10-CM | POA: Diagnosis not present

## 2019-09-10 NOTE — Patient Instructions (Addendum)
  Tiffany Jarvis , Thank you for taking time to come for your Medicare Wellness Visit. I appreciate your ongoing commitment to your health goals. Please review the following plan we discussed and let me know if I can assist you in the future.   These are the goals we discussed: Goals      Patient Stated   . Increase physical activity (pt-stated)     Use the stationary bike       This is a list of the screening recommended for you and due dates:  Health Maintenance  Topic Date Due  . Pneumonia vaccines (2 of 2 - PPSV23) 05/26/2020  . Tetanus Vaccine  09/05/2020  . Mammogram  02/23/2021  . Colon Cancer Screening  06/08/2025  . Flu Shot  Completed  . DEXA scan (bone density measurement)  Completed  .  Hepatitis C: One time screening is recommended by Center for Disease Control  (CDC) for  adults born from [birth year] through 1963.   Completed

## 2019-09-10 NOTE — Progress Notes (Signed)
Subjective:   Tiffany Jarvis is a 69 y.o. female who presents for an Initial Medicare Annual Wellness Visit.  Review of Systems    No ROS.  Medicare Wellness Virtual Visit.  Visual/audio telehealth visit, UTA vital signs.   Ht/Wt provided.  See social history for additional risk factors.    Cardiac Risk Factors include: advanced age (>39men, >9 women);hypertension     Objective:    Today's Vitals   09/10/19 1412  Weight: 203 lb (92.1 kg)  Height: 5\' 4"  (1.626 m)   Body mass index is 34.84 kg/m.  Advanced Directives 09/10/2019 03/04/2019  Does Patient Have a Medical Advance Directive? No No  Would patient like information on creating a medical advance directive? No - Patient declined No - Patient declined    Current Medications (verified) Outpatient Encounter Medications as of 09/10/2019  Medication Sig  . amLODipine (NORVASC) 2.5 MG tablet Take 1 tablet (2.5 mg total) by mouth daily.  Marland Kitchen aspirin 81 MG tablet Take 81 mg by mouth daily.  . Calcium Carbonate (CALCIUM 600 PO) Take 1 tablet by mouth daily.  Marland Kitchen lisinopril-hydrochlorothiazide (ZESTORETIC) 20-12.5 MG tablet Take 2 tablets by mouth daily. In am  . meclizine (ANTIVERT) 25 MG tablet Take by mouth.  . meloxicam (MOBIC) 15 MG tablet meloxicam 15 mg tablet  . Multiple Vitamin (MULTI-VITAMIN DAILY PO) Multi Vitamin  . Multiple Vitamins-Minerals (VITAMIN D3 COMPLETE PO) Take 2,000 Units by mouth daily.  . Omega-3 Fatty Acids (FISH OIL PO) Take by mouth.  . pravastatin (PRAVACHOL) 40 MG tablet Take 1 tablet (40 mg total) by mouth at bedtime.  Marland Kitchen venlafaxine XR (EFFEXOR XR) 150 MG 24 hr capsule Take 1 capsule (150 mg total) by mouth daily with breakfast.  . [DISCONTINUED] Calcium Carbonate-Vit D-Min (CALCIUM 1200 PO) Take 1 tablet by mouth daily.    No facility-administered encounter medications on file as of 09/10/2019.    Allergies (verified) Patient has no known allergies.   History: Past Medical History:  Diagnosis  Date  . Allergy   . 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 Colon polyps   . History of chicken pox   . Hyperlipidemia   . Hypertension   . OSA (obstructive sleep apnea)    not able to tolerate cpap or dental mouth piece   . Vertigo    CT neck and head negative significant CAS    Past Surgical History:  Procedure Laterality Date  . BREAST BIOPSY Left 2003   positive  . BREAST SURGERY     L mastectomy ~2001/2002 and reconstruction  . CESAREAN SECTION     x 3 1979, 1982, 1992   . MASTECTOMY Left 2003  . REDUCTION MAMMAPLASTY Right   . tonsillectomy     Family History  Problem Relation Age of Onset  . Cancer Sister        breast cancer   . Breast cancer Sister   . Cancer Cousin        m 1st cousin breast and pancreatic ca  . Breast cancer Cousin   . Cancer Cousin        breast m 1st cousin  . Breast cancer Cousin   . Cancer Cousin        m 1st cousin breast   . Breast cancer Cousin   . Cancer Cousin        m 1st cousin breast   . Breast cancer Cousin   . Cancer  Mother        colon cancer   Social History   Socioeconomic History  . Marital status: Married    Spouse name: Not on file  . Number of children: Not on file  . Years of education: Not on file  . Highest education level: Not on file  Occupational History  . Not on file  Tobacco Use  . Smoking status: Former Research scientist (life sciences)  . Smokeless tobacco: Never Used  . Tobacco comment: former smoker x 3 years 19-22 light   Substance and Sexual Activity  . Alcohol use: Yes  . Drug use: Not Currently  . Sexual activity: Not Currently    Partners: Male  Other Topics Concern  . Not on file  Social History Narrative   Moved from [place] 2019    3 kids and grandkids    Statistician, retired    Married    No guns, wears seat belt safe in relationship    Social Determinants of Radio broadcast assistant Strain:   . Difficulty of Paying Living Expenses: Not on file  Food Insecurity:   . Worried  About Charity fundraiser in the Last Year: Not on file  . Ran Out of Food in the Last Year: Not on file  Transportation Needs:   . Lack of Transportation (Medical): Not on file  . Lack of Transportation (Non-Medical): Not on file  Physical Activity:   . Days of Exercise per Week: Not on file  . Minutes of Exercise per Session: Not on file  Stress:   . Feeling of Stress : Not on file  Social Connections:   . Frequency of Communication with Friends and Family: Not on file  . Frequency of Social Gatherings with Friends and Family: Not on file  . Attends Religious Services: Not on file  . Active Member of Clubs or Organizations: Not on file  . Attends Archivist Meetings: Not on file  . Marital Status: Not on file    Tobacco Counseling Counseling given: Not Answered Comment: former smoker x 3 years 19-22 light    Clinical Intake:  Pre-visit preparation completed: Yes        Diabetes: No  How often do you need to have someone help you when you read instructions, pamphlets, or other written materials from your doctor or pharmacy?: 1 - Never  Interpreter Needed?: No      Activities of Daily Living In your present state of health, do you have any difficulty performing the following activities: 09/10/2019  Hearing? N  Vision? N  Difficulty concentrating or making decisions? N  Walking or climbing stairs? N  Dressing or bathing? N  Doing errands, shopping? N  Preparing Food and eating ? N  Using the Toilet? N  In the past six months, have you accidently leaked urine? N  Do you have problems with loss of bowel control? N  Managing your Medications? N  Managing your Finances? N  Housekeeping or managing your Housekeeping? N  Some recent data might be hidden     Immunizations and Health Maintenance Immunization History  Administered Date(s) Administered  . Fluad Quad(high Dose 65+) 03/06/2019  . Influenza, Seasonal, Injecte, Preservative Fre 04/30/2013,  05/18/2014, 05/18/2015, 05/18/2017  . Influenza,inj,Quad PF,6+ Mos 05/23/2015, 05/10/2016, 03/14/2018  . Influenza,inj,quad, With Preservative 03/27/2018  . PFIZER SARS-COV-2 Vaccination 08/08/2019, 08/29/2019  . Pneumococcal Conjugate-13 05/27/2019  . Tdap 09/05/2010  . Zoster Recombinat (Shingrix) 04/01/2018, 06/05/2018   There are no preventive  care reminders to display for this patient.  Patient Care Team: Crecencio Mc, MD as PCP - General (Internal Medicine)  Indicate any recent Medical Services you may have received from other than [facility] providers in the past year (date may be approximate).     Assessment:   This is a routine wellness examination for Prentiss Bells.  Nurse connected with patient 09/10/19 at  2:00 PM EST by a telephone enabled telemedicine application and verified that I am speaking with the correct person using two identifiers. Patient stated full name and DOB. Patient gave permission to continue with virtual visit. Patient's location was at home and Nurse's location was at [place] office.   Patient is alert and oriented x3. Patient denies difficulty focusing or concentrating. Patient takes online drawing, reads with a book club, education advocate online and has a puppy for brain stimulation.   Health Maintenance Due: -Mammogram- ordered 02/2019 See completed HM at the end of note.   Eye: Visual acuity not assessed. Virtual visit. Followed by their ophthalmologist.  Recent L eye surgery; retina eye specialist.  Dental: Visits every 6 months.    Hearing: Demonstrates normal hearing during visit.  Safety:  Patient feels safe at home- yes Patient does have smoke detectors at home- yes Patient does wear sunscreen or protective clothing when in direct sunlight - yes Patient does wear seat belt when in a moving vehicle - yes Patient drives- yes Adequate lighting in walkways free from debris- yes Grab bars and handrails used as appropriate- yes Ambulates with  an assistive device- no  Social: Alcohol intake - yes      Smoking history- never   Smokers in home? none Illicit drug use? none  Medication: Taking as directed and without issues.  Pill box in use -yes  Self managed - yes   Covid-19: Precautions and sickness symptoms discussed. Wears mask, social distancing, hand hygiene as appropriate.   Activities of Daily Living Patient denies needing assistance with: household chores, feeding themselves, getting from bed to chair, getting to the toilet, bathing/showering, dressing, managing money, or preparing meals.   Discussed the importance of a healthy diet, water intake and the benefits of aerobic exercise.   Physical activity- walking  Diet:  Low carb  Water: fair intake Caffeine: 1 cup of coffee  Other Providers Patient Care Team: Crecencio Mc, MD as PCP - General (Internal Medicine)  Hearing/Vision screen  Hearing Screening   125Hz  250Hz  500Hz  1000Hz  2000Hz  3000Hz  4000Hz  6000Hz  8000Hz   Right ear:           Left ear:           Comments: Patient is able to hear conversational tones without difficulty.  No issues reported.   Vision Screening Comments: Visual acuity not assessed, virtual visit.  They have seen their ophthalmologist in the last 12 months.     Dietary issues and exercise activities discussed: Current Exercise Habits: Home exercise routine, Type of exercise: walking, Intensity: Mild  Goals      Patient Stated   . Increase physical activity (pt-stated)     Use the stationary bike      Depression Screen PHQ 2/9 Scores 09/10/2019 05/28/2018  PHQ - 2 Score 0 0    Fall Risk Fall Risk  09/10/2019 07/04/2019 05/28/2018  Falls in the past year? 0 0 0  Follow up Falls evaluation completed Falls evaluation completed -    Timed Get Up and Go Performed no, virtual visit  Cognitive Function:  6CIT Screen 09/10/2019  What Year? 0 points  What month? 0 points  What time? 0 points    Screening  Tests Health Maintenance  Topic Date Due  . PNA vac Low Risk Adult (2 of 2 - PPSV23) 05/26/2020  . TETANUS/TDAP  09/05/2020  . MAMMOGRAM  02/23/2021  . COLONOSCOPY  06/08/2025  . INFLUENZA VACCINE  Completed  . DEXA SCAN  Completed  . Hepatitis C Screening  Completed      Plan:   Keep all routine maintenance appointments.   Follow up 09/23/19 @ 2:30.  Medicare Attestation I have personally reviewed: The patient's medical and social history Their use of alcohol, tobacco or illicit drugs Their current medications and supplements The patient's functional ability including ADLs,fall risks, home safety risks, cognitive, and hearing and visual impairment Diet and physical activities Evidence for depression   I have reviewed and discussed with patient certain preventive protocols, quality metrics, and best practice recommendations.      Varney Biles, LPN   [id number]

## 2019-09-23 ENCOUNTER — Encounter: Payer: Self-pay | Admitting: Internal Medicine

## 2019-09-23 ENCOUNTER — Telehealth (INDEPENDENT_AMBULATORY_CARE_PROVIDER_SITE_OTHER): Payer: Medicare Other | Admitting: Internal Medicine

## 2019-09-23 ENCOUNTER — Other Ambulatory Visit: Payer: Self-pay

## 2019-09-23 VITALS — BP 144/89 | Ht 64.0 in | Wt 203.0 lb

## 2019-09-23 DIAGNOSIS — I10 Essential (primary) hypertension: Secondary | ICD-10-CM

## 2019-09-23 DIAGNOSIS — R7303 Prediabetes: Secondary | ICD-10-CM

## 2019-09-23 DIAGNOSIS — E782 Mixed hyperlipidemia: Secondary | ICD-10-CM

## 2019-09-23 NOTE — Assessment & Plan Note (Signed)
Improved with pravastatin   Lab Results  Component Value Date   CHOL 205 (H) 09/03/2019   HDL 61 09/03/2019   LDLCALC 110 (H) 09/03/2019   TRIG 215 (H) 09/03/2019   CHOLHDL 3.4 09/03/2019

## 2019-09-23 NOTE — Progress Notes (Addendum)
Virtual Visit via [place]  This visit type was conducted due to national recommendations for restrictions regarding the COVID-19 pandemic (e.g. social distancing).  This format is felt to be most appropriate for this patient at this time.  All issues noted in this document were discussed and addressed.  No physical exam was performed (except for noted visual exam findings with Video Visits).   I connected with@ on 09/23/19 at  2:30 PM EST by a video enabled telemedicine application and verified that I am speaking with the correct person using two identifiers. Location patient: home Location provider: work or home office Persons participating in the virtual visit: patient, provider  I discussed the limitations, risks, security and privacy concerns of performing an evaluation and management service by telephone and the availability of in person appointments. I also discussed with the patient that there may be a patient responsible charge related to this service. The patient expressed understanding and agreed to proceed.  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   HTN:  Home readings of BP have not been  done except for  march 4.  She is not sure if her machine is accurate,   It is 67 yrs old   Asked to submite 5 readings in next 3 weeks,  For adjustment of meds  Follow up 6 months    ROS: See pertinent positives and negatives per HPI.  Past Medical History:  Diagnosis Date  . Allergy   . 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 Colon polyps   . History of chicken pox   . Hyperlipidemia   . Hypertension   . OSA (obstructive sleep apnea)    not able to tolerate cpap or dental mouth piece   . Vertigo    CT neck and head negative significant CAS     Past Surgical History:   Procedure Laterality Date  . BREAST BIOPSY Left 2003   positive  . BREAST SURGERY     L mastectomy ~2001/2002 and reconstruction  . CESAREAN SECTION     x 3 1979, 1982, 1992   . MASTECTOMY Left 2003  . REDUCTION MAMMAPLASTY Right   . tonsillectomy      Family History  Problem Relation Age of Onset  . Cancer Sister        breast cancer   . Breast cancer Sister   . Cancer Cousin        m 1st cousin breast and pancreatic ca  . Breast cancer Cousin   . Cancer Cousin        breast m 1st cousin  . Breast cancer Cousin   . Cancer Cousin        m 1st cousin breast   . Breast cancer Cousin   . Cancer Cousin        m 1st cousin breast   . Breast cancer Cousin   . Cancer Mother        colon cancer    SOCIAL HX:  reports that she has quit smoking. She has never used smokeless tobacco. She reports current alcohol use. She reports previous drug use.   Current Outpatient Medications:  .  amLODipine (NORVASC) 2.5 MG tablet, Take 1 tablet (2.5 mg total) by mouth daily., Disp: 90 tablet, Rfl: 3 .  aspirin 81 MG tablet, Take 81 mg by mouth daily., Disp: , Rfl:  .  Calcium Carbonate (CALCIUM 600 PO), Take 1 tablet by mouth daily., Disp: , Rfl:  .  lisinopril-hydrochlorothiazide (ZESTORETIC) 20-12.5 MG tablet, Take 2 tablets by mouth daily. In am, Disp: 180 tablet, Rfl: 3 .  Multiple Vitamin (MULTI-VITAMIN DAILY PO), Multi Vitamin, Disp: , Rfl:  .  Multiple Vitamins-Minerals (VITAMIN D3 COMPLETE PO), Take 2,000 Units by mouth daily., Disp: , Rfl:  .  Omega-3 Fatty Acids (FISH OIL PO), Take by mouth., Disp: , Rfl:  .  pravastatin (PRAVACHOL) 40 MG tablet, Take 1 tablet (40 mg total) by mouth at bedtime., Disp: 90 tablet, Rfl: 3 .  venlafaxine XR (EFFEXOR XR) 150 MG 24 hr capsule, Take 1 capsule (150 mg total) by mouth daily with breakfast., Disp: 90 capsule, Rfl: 3 .  meclizine (ANTIVERT) 25 MG tablet, Take by mouth., Disp: , Rfl:  .  meloxicam (MOBIC) 15 MG tablet, meloxicam 15 mg tablet,  Disp: , Rfl:   EXAM:  VITALS per patient if applicable:  GENERAL: alert, oriented, appears well and in no acute distress  HEENT: atraumatic, conjunttiva clear, no obvious abnormalities on inspection of external nose and ears  NECK: normal movements of the head and neck  LUNGS: on inspection no signs of respiratory distress, breathing rate appears normal, no obvious gross SOB, gasping or wheezing  CV: no obvious cyanosis  MS: moves all visible extremities without noticeable abnormality  PSYCH/NEURO: pleasant and cooperative, no obvious depression or anxiety, speech and thought processing grossly intact  ASSESSMENT AND PLAN:  Discussed the following assessment and plan:  Prediabetes - Plan: Hemoglobin A1c, Comprehensive metabolic panel  Mixed hyperlipidemia - Plan: Lipid panel  Essential hypertension  Prediabetes Her  random glucose is not  elevated but her A1c suggests she is at risk for developing diabetes.  Diet and exercise history , role of both in preventing progression to diabetes  discussed in detail   HTN (hypertension) she reports compliance with medication regimen  but has an elevated reading today at home .  She is not using NSAIDs daily.  Discussed goal of 120/70  (130/80 for patients over 70)  to preserve renal function.  She has been asked to check her  BP  at home and  submit readings for evaluation. Renal function, electrolytes and screen for proteinuria are all normal .  HLD (hyperlipidemia) Improved with pravastatin   Lab Results  Component Value Date   CHOL 205 (H) 09/03/2019   HDL 61 09/03/2019   LDLCALC 110 (H) 09/03/2019   TRIG 215 (H) 09/03/2019   CHOLHDL 3.4 09/03/2019       I discussed the assessment and treatment plan with the patient. The patient was provided an opportunity to ask questions and all were answered. The patient agreed with the plan and demonstrated an understanding of the instructions.   The patient was advised to call back  or seek an in-person evaluation if the symptoms worsen or if the condition fails to improve as anticipated.  I provided  30 minutes of non-face-to-face time during this encounter reviewing patient's current problems and past surgeries, labs and imaging studies, providing counseling on the above mentioned problems , and coordination  of care .   Crecencio Mc, MD

## 2019-09-23 NOTE — Assessment & Plan Note (Signed)
Her  random glucose is not  elevated but her A1c suggests she is at risk for developing diabetes.  Diet and exercise history , role of both in preventing progression to diabetes  discussed in detail

## 2019-09-23 NOTE — Assessment & Plan Note (Signed)
she reports compliance with medication regimen  but has an elevated reading today at home .  She is not using NSAIDs daily.  Discussed goal of 120/70  (130/80 for patients over 70)  to preserve renal function.  She has been asked to check her  BP  at home and  submit readings for evaluation. Renal function, electrolytes and screen for proteinuria are all normal .

## 2019-11-24 DIAGNOSIS — G4733 Obstructive sleep apnea (adult) (pediatric): Secondary | ICD-10-CM

## 2019-11-27 NOTE — Addendum Note (Signed)
Addended by: Crecencio Mc on: 11/27/2019 02:28 PM   Modules accepted: Orders

## 2019-12-05 ENCOUNTER — Encounter: Payer: Self-pay | Admitting: Internal Medicine

## 2019-12-05 ENCOUNTER — Telehealth: Payer: Self-pay | Admitting: Internal Medicine

## 2019-12-05 NOTE — Telephone Encounter (Signed)
Rejection Reason - Patient went elsewhere - Please send to [facility] said 38 minutes ago

## 2019-12-22 NOTE — Telephone Encounter (Signed)
Pt called to inquire about referral

## 2019-12-30 ENCOUNTER — Telehealth: Payer: Self-pay | Admitting: Internal Medicine

## 2019-12-30 ENCOUNTER — Ambulatory Visit
Admission: RE | Admit: 2019-12-30 | Discharge: 2019-12-30 | Disposition: A | Discharge status: Home / Self Care | Payer: Medicare Other | Source: Ambulatory Visit | Attending: Emergency Medicine | Admitting: Emergency Medicine

## 2019-12-30 ENCOUNTER — Other Ambulatory Visit: Payer: Self-pay

## 2019-12-30 VITALS — BP 131/87 | HR 88 | Temp 99.4°F | Resp 18 | Ht 63.0 in | Wt 195.0 lb

## 2019-12-30 DIAGNOSIS — S0006XA Insect bite (nonvenomous) of scalp, initial encounter: Secondary | ICD-10-CM | POA: Diagnosis not present

## 2019-12-30 DIAGNOSIS — W57XXXA Bitten or stung by nonvenomous insect and other nonvenomous arthropods, initial encounter: Secondary | ICD-10-CM

## 2019-12-30 DIAGNOSIS — R509 Fever, unspecified: Secondary | ICD-10-CM

## 2019-12-30 MED ORDER — DOXYCYCLINE HYCLATE 100 MG PO CAPS: 100.0000 mg | ORAL_CAPSULE | Freq: Two times a day (BID) | ORAL | 0 refills | Status: AC

## 2019-12-30 NOTE — ED Provider Notes (Signed)
Tiffany Jarvis    CSN: [id number] Arrival date & time: 12/30/19  0957      History   Chief Complaint Chief Complaint  Patient presents with  . Fever    HPI Tiffany Jarvis is a 69 y.o. female.   Patient presents with fever, headache, chills x5 days.  T-max 100.6 which occurred 2 days ago.  She states she had an insect bite in her scalp 2 weeks ago and continues to have discomfort at the site.  She denies rash, body aches, focal weakness, numbness, sore throat, chest pain, cough, shortness of breath, abdominal pain, vomiting, diarrhea, or other symptoms.  The history is provided by the patient.    Past Medical History:  Diagnosis Date  . Allergy   . 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 Colon polyps   . History of chicken pox   . Hyperlipidemia   . Hypertension   . OSA (obstructive sleep apnea)    not able to tolerate cpap or dental mouth piece   . Vertigo    CT neck and head negative significant CAS     Patient Active Problem List   Diagnosis Date Noted  . 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 Encounter for preventive health examination 03/09/2019  . Influenza due to influenza virus, type B 08/25/2018  . Anxiety 07/31/2018  . Obesity (BMI 30-39.9) 05/29/2018  . History of ductal carcinoma in situ (DCIS) of breast 05/28/2018  . Prediabetes 05/28/2018  . OSA (obstructive sleep apnea) 05/28/2018  . HTN (hypertension) 05/28/2018  . HLD (hyperlipidemia) 05/28/2018  . Vertigo 05/28/2018    Past Surgical History:  Procedure Laterality Date  . BREAST BIOPSY Left 2003   positive  . BREAST SURGERY     L mastectomy ~2001/2002 and reconstruction  . CESAREAN SECTION     x 3 1979, 1982, 1992   . MASTECTOMY Left 2003  . REDUCTION MAMMAPLASTY Right   . tonsillectomy      OB History   No obstetric history on file.      Home Medications    Prior to Admission medications   Medication Sig Start Date End Date Taking? Authorizing Provider    amLODipine (NORVASC) 2.5 MG tablet Take 1 tablet (2.5 mg total) by mouth daily. 07/08/19  Yes Crecencio Mc, MD  aspirin 81 MG tablet Take 81 mg by mouth daily.   Yes [provider]  Calcium Carbonate (CALCIUM 600 PO) Take 1 tablet by mouth daily.   Yes [provider]  lisinopril-hydrochlorothiazide (ZESTORETIC) 20-12.5 MG tablet Take 2 tablets by mouth daily. In am 07/08/19  Yes Crecencio Mc, MD  meclizine (ANTIVERT) 25 MG tablet Take by mouth. 02/15/18  Yes [provider]  Multiple Vitamin (MULTI-VITAMIN DAILY PO) Multi Vitamin   Yes [provider]  Multiple Vitamins-Minerals (VITAMIN D3 COMPLETE PO) Take 2,000 Units by mouth daily.   Yes [provider]  Omega-3 Fatty Acids (FISH OIL PO) Take by mouth.   Yes [provider]  pravastatin (PRAVACHOL) 40 MG tablet Take 1 tablet (40 mg total) by mouth at bedtime. 06/27/19  Yes Crecencio Mc, MD  venlafaxine XR (EFFEXOR XR) 150 MG 24 hr capsule Take 1 capsule (150 mg total) by mouth daily with breakfast. 07/08/19  Yes Crecencio Mc, MD  doxycycline (VIBRAMYCIN) 100 MG capsule Take 1 capsule (100 mg total) by mouth 2 (two) times daily for 10 days. 12/30/19 01/09/20  Barkley Boards  H, NP  meloxicam (MOBIC) 15 MG tablet meloxicam 15 mg tablet    [provider]    Family History Family History  Problem Relation Age of Onset  . Cancer Sister        breast cancer   . Breast cancer Sister   . Cancer Cousin        m 1st cousin breast and pancreatic ca  . Breast cancer Cousin   . Cancer Cousin        breast m 1st cousin  . Breast cancer Cousin   . Cancer Cousin        m 1st cousin breast   . Breast cancer Cousin   . Cancer Cousin        m 1st cousin breast   . Breast cancer Cousin   . Cancer Mother        colon cancer    Social History Social History   Tobacco Use  . Smoking status: Former Research scientist (life sciences)  . Smokeless tobacco: Never Used  . Tobacco comment: former smoker x  3 years 19-24 light   Vaping Use  . Vaping Use: Never used  Substance Use Topics  . Alcohol use: Yes  . Drug use: Not Currently     Allergies   Patient has no known allergies.   Review of Systems Review of Systems  Constitutional: Positive for chills and fever.  HENT: Negative for ear pain and sore throat.   Eyes: Negative for pain and visual disturbance.  Respiratory: Negative for cough and shortness of breath.   Cardiovascular: Negative for chest pain and palpitations.  Gastrointestinal: Negative for abdominal pain, diarrhea, nausea and vomiting.  Genitourinary: Negative for dysuria and hematuria.  Musculoskeletal: Negative for arthralgias and back pain.  Skin: Positive for wound. Negative for color change and rash.  Neurological: Positive for headaches. Negative for dizziness, seizures, syncope, weakness and numbness.  All other systems reviewed and are negative.    Physical Exam Triage Vital Signs ED Triage Vitals  Enc Vitals Group     BP      Pulse      Resp      Temp      Temp src      SpO2      Weight      Height      Head Circumference      Peak Flow      Pain Score      Pain Loc      Pain Edu?      Excl. in [place]?    No data found.  Updated Vital Signs BP 131/87 (BP Location: Left Arm)   Pulse 88   Temp 99.4 F (37.4 C) (Oral)   Resp 18   Ht 5\' 3"  (1.6 m)   Wt 195 lb (88.5 kg)   SpO2 95%   BMI 34.54 kg/m   Visual Acuity Right Eye Distance:   Left Eye Distance:   Bilateral Distance:    Right Eye Near:   Left Eye Near:    Bilateral Near:     Physical Exam Vitals and nursing note reviewed.  Constitutional:      General: She is not in acute distress.    Appearance: She is well-developed. She is not ill-appearing.  HENT:     Head: Normocephalic and atraumatic.     Right Ear: Tympanic membrane normal.     Left Ear: Tympanic membrane normal.     Nose: Nose normal.  Mouth/Throat:     Mouth: Mucous membranes are moist.     Pharynx:  Oropharynx is clear.  Eyes:     Conjunctiva/sclera: Conjunctivae normal.  Cardiovascular:     Rate and Rhythm: Normal rate and regular rhythm.     Heart sounds: No murmur heard.   Pulmonary:     Effort: Pulmonary effort is normal. No respiratory distress.     Breath sounds: Normal breath sounds.  Abdominal:     Palpations: Abdomen is soft.     Tenderness: There is no abdominal tenderness. There is no guarding or rebound.  Musculoskeletal:        General: Normal range of motion.     Cervical back: Neck supple.     Right lower leg: No edema.     Left lower leg: No edema.  Skin:    General: Skin is warm and dry.     Findings: Lesion present. No rash.     Comments: 0.5 cm crusted lesion in left posterior scalp; localized erythema. No drainage.  Neurological:     General: No focal deficit present.     Mental Status: She is alert and oriented to person, place, and time.     Sensory: No sensory deficit.     Motor: No weakness.     Gait: Gait normal.  Psychiatric:        Mood and Affect: Mood normal.        Behavior: Behavior normal.      UC Treatments / Results  Labs (all labs ordered are listed, but only abnormal results are displayed) Labs Reviewed  B. BURGDORFI ANTIBODIES  ROCKY MTN SPOTTED FVR ABS PNL(IGG+IGM)    EKG   Radiology No results found.  Procedures Procedures (including critical care time)  Medications Ordered in UC Medications - No data to display  Initial Impression / Assessment and Plan / UC Course  I have reviewed the triage vital signs and the nursing notes.  Pertinent labs & imaging results that were available during my care of the patient were reviewed by me and considered in my medical decision making (see chart for details).   Fever, Insect bite of scalp.  Lyme and RMSF pending.  Treating with 10 day course of doxycycline.  Discussed with patient that I will call her with the lab results if they are positive; also discussed that she can  track them on MyChart.  Instructed her to follow-up with her PCP if her symptoms or not improving.  Patient agrees to plan of care.   Final Clinical Impressions(s) / UC Diagnoses   Final diagnoses:  Fever, unspecified  Insect bite of scalp, initial encounter     Discharge Instructions     Take the doxycycline as directed.    Your lab test results will be back in a few days.    Follow up with your primary care provider if your symptoms are not improving.        ED Prescriptions    Medication Sig Dispense Auth. Provider   doxycycline (VIBRAMYCIN) 100 MG capsule Take 1 capsule (100 mg total) by mouth 2 (two) times daily for 10 days. 20 capsule Sharion Balloon, NP     PDMP not reviewed this encounter.   Sharion Balloon, NP 12/30/19 1044

## 2019-12-30 NOTE — Telephone Encounter (Signed)
Pt states that she has been running a fever for five days. Pt refused virtual appt and advice to go to urgent care. Pt insists that she has been vaccinated for Covid. Pt wants Dr Derrel Nip to give her advice without virtual or telephone appt. Please advise.

## 2019-12-30 NOTE — Discharge Instructions (Signed)
Take the doxycycline as directed.    Your lab test results will be back in a few days.    Follow up with your primary care provider if your symptoms are not improving.

## 2019-12-30 NOTE — Telephone Encounter (Signed)
[place] RECORD [facility]urse Patient Name: Tiffany Jarvis Gender: Female DOB: 1950-10-08 Age: 69 Y 64 M 12 D Return Phone Number: [phone] (Primary) Address: City/State/Zip: [place] [address] Client [name] - [facility] Client Site [place] - Day Physician Deborra Medina - MD Contact Type Call Who Is Calling Patient / Member / Family / Caregiver Call Type Triage / Clinical Relationship To Patient Self Return Phone Number [phone] (Primary) Chief Complaint Headache Reason for Call Symptomatic / Request for [place] states she has had a fever for five days recently it was 99.6 with chills and a headache. Translation No Nurse Assessment Nurse: Kathi Ludwig, RN, Leana Roe Date/Time (Eastern Time): 12/30/2019 7:13:01 AM Confirm and document reason for call. If symptomatic, describe symptoms. ---Caller states temp 99.6 with headache. taking Ibuprofen and Tylenol. chills. no other sx Has the patient had close contact with a person known or suspected to have the novel coronavirus illness OR traveled / lives in area with major community spread (including international travel) in the last 14 days from the onset of symptoms? * If Asymptomatic, screen for exposure and travel within the last 14 days. ---No Does the patient have any new or worsening symptoms? ---Yes Will a triage be completed? ---Yes Related visit to physician within the last 2 weeks? ---No Does the PT have any chronic conditions? (i.e. diabetes, asthma, this includes High risk factors for pregnancy, etc.) ---Yes List chronic conditions. ---HTN Is this a behavioral health or substance abuse call? ---No Guidelines Guideline Title Affirmed Question Affirmed Notes Nurse Date/Time (Eastern Time) Headache [1] MODERATE headache (e.g., interferes with normal activities) AND  [2] present > 24 hours AND [3] unexplained (Exceptions: analgesics not tried, Kathi Ludwig, Therapist, sports, Leana Roe 12/30/2019 7:14:08 AMPLEASE NOTE: All timestamps contained within this report are represented as [place] Standard Time. CONFIDENTIALTY NOTICE: This fax transmission is intended only for the addressee. It contains information that is legally privileged, confidential or otherwise protected from use or disclosure. If you are not the intended recipient, you are strictly prohibited from reviewing, disclosing, copying using or disseminating any of this information or taking any action in reliance on or regarding this information. If you have received this fax in error, please notify us immediately by telephone so that we can arrange for its return to [place]. Phone: [phone], Toll-Free: [phone], Fax: [phone] Page: 2 of 2 Call Id: [id number] Guidelines Guideline Title Affirmed Question Affirmed Notes Nurse Date/Time Eilene Ghazi Time) typical migraine, or headache part of viral illness) Disp. Time Eilene Ghazi Time) Disposition Final User 12/30/2019 7:17:42 AM See PCP within 24 Hours Yes Kathi Ludwig, RN, Leana Roe Caller Disagree/Comply Comply Caller Understands Yes PreDisposition Did not know what to do Care Advice Given Per Guideline SEE PCP WITHIN 24 HOURS: * IF OFFICE WILL BE OPEN: You need to be examined within the next 24 hours. Call your doctor (or NP/PA) when the office opens and make an appointment. PAIN MEDICINES: * ACETAMINOPHEN - REGULAR STRENGTH TYLENOL: Take 650 mg (two 325 mg pills) by mouth every 4 to 6 hours as needed. Each Regular Strength Tylenol pill has 325 mg of acetaminophen. The most you should take each day is 3,250 mg (10 pills a day). * IBUPROFEN (E.G., MOTRIN, ADVIL): Take 400 mg (two 200 mg pills) by mouth every 6 hours. The most you should take each day is 1,200 mg (six 200 mg pills), unless your doctor has told you to take more.  CALL BACK IF: * You become worse. CARE ADVICE  given per Headache (Adult) guideline. STRETCHING: * Stretch and massage any tight neck muscles. LOCAL COLD: * Apply a cold wet washcloth or cold pack to the forehead for 20 minutes. REST: * Lie down in a dark quiet place and relax until feeling better. Comments User: Estevan Ryder, RN Date/Time (Eastern Time): 12/30/2019 7:15:35 AM bug bite to back of head with swelling a week ago Referrals REFERRED TO PCP OFFICE

## 2019-12-30 NOTE — Telephone Encounter (Signed)
Pt went to UC.

## 2019-12-30 NOTE — ED Triage Notes (Addendum)
Pt c/o fever (100.6), headache, chills. Started 5 days ago. She states she has had her covid vaccines. Denies urinary symptoms. Pt declines covid testing

## 2019-12-31 ENCOUNTER — Ambulatory Visit: Payer: Self-pay

## 2020-01-01 LAB — ROCKY MTN SPOTTED FVR ABS PNL(IGG+IGM)
RMSF IgG: NEGATIVE
RMSF IgM: 0.14 index (ref 0.00–0.89)

## 2020-01-01 LAB — B. BURGDORFI ANTIBODIES: Lyme IgG/IgM Ab: <0.91 {ISR} (ref 0.00–0.90)

## 2020-01-22 ENCOUNTER — Encounter: Payer: Self-pay | Admitting: Ophthalmology

## 2020-01-22 ENCOUNTER — Other Ambulatory Visit: Payer: Self-pay

## 2020-01-26 NOTE — Discharge Instructions (Signed)

## 2020-01-28 ENCOUNTER — Ambulatory Visit: Payer: Medicare Other | Admitting: Anesthesiology

## 2020-01-28 ENCOUNTER — Encounter: Payer: Self-pay | Admitting: Ophthalmology

## 2020-01-28 ENCOUNTER — Other Ambulatory Visit: Payer: Self-pay

## 2020-01-28 ENCOUNTER — Encounter
Admission: RE | Disposition: A | Discharge status: Home / Self Care | Payer: Self-pay | Source: Home / Self Care | Attending: Ophthalmology

## 2020-01-28 ENCOUNTER — Ambulatory Visit
Admission: RE | Admit: 2020-01-28 | Discharge: 2020-01-28 | Disposition: A | Discharge status: Home / Self Care | Payer: Medicare Other | Attending: Ophthalmology | Admitting: Ophthalmology

## 2020-01-28 DIAGNOSIS — Z7982 Long term (current) use of aspirin: Secondary | ICD-10-CM | POA: Diagnosis not present

## 2020-01-28 DIAGNOSIS — Z87891 Personal history of nicotine dependence: Secondary | ICD-10-CM | POA: Insufficient documentation

## 2020-01-28 DIAGNOSIS — Z9010 Acquired absence of unspecified breast and nipple: Secondary | ICD-10-CM | POA: Insufficient documentation

## 2020-01-28 DIAGNOSIS — I10 Essential (primary) hypertension: Secondary | ICD-10-CM | POA: Insufficient documentation

## 2020-01-28 DIAGNOSIS — Z79899 Other long term (current) drug therapy: Secondary | ICD-10-CM | POA: Diagnosis not present

## 2020-01-28 DIAGNOSIS — Z853 Personal history of malignant neoplasm of breast: Secondary | ICD-10-CM | POA: Insufficient documentation

## 2020-01-28 DIAGNOSIS — E7800 Pure hypercholesterolemia, unspecified: Secondary | ICD-10-CM | POA: Insufficient documentation

## 2020-01-28 DIAGNOSIS — F329 Major depressive disorder, single episode, unspecified: Secondary | ICD-10-CM | POA: Insufficient documentation

## 2020-01-28 DIAGNOSIS — H2512 Age-related nuclear cataract, left eye: Secondary | ICD-10-CM | POA: Diagnosis present

## 2020-01-28 DIAGNOSIS — G4730 Sleep apnea, unspecified: Secondary | ICD-10-CM | POA: Diagnosis not present

## 2020-01-28 HISTORY — PX: CATARACT EXTRACTION W/PHACO: SHX586

## 2020-01-28 SURGERY — PHACOEMULSIFICATION, CATARACT, WITH IOL INSERTION
Anesthesia: Monitor Anesthesia Care | Site: Eye | Laterality: Left

## 2020-01-28 MED ORDER — TETRACAINE HCL 0.5 % OP SOLN
1.0000 [drp] | OPHTHALMIC | Status: DC | PRN
  Administered 2020-01-28 (×3): 1 [drp] via OPHTHALMIC

## 2020-01-28 MED ORDER — LACTATED RINGERS IV SOLN: INTRAVENOUS | Status: DC

## 2020-01-28 MED ORDER — EPINEPHRINE PF 1 MG/ML IJ SOLN: INTRAOCULAR | Status: DC | PRN

## 2020-01-28 MED ORDER — MIDAZOLAM HCL 2 MG/2ML IJ SOLN
INTRAMUSCULAR | Status: DC | PRN
  Administered 2020-01-28 (×2): 1 mg via INTRAVENOUS

## 2020-01-28 MED ORDER — ARMC OPHTHALMIC DILATING DROPS
1.0000 "application " | OPHTHALMIC | Status: DC | PRN
  Administered 2020-01-28 (×3): 1 via OPHTHALMIC

## 2020-01-28 MED ORDER — LIDOCAINE HCL (PF) 2 % IJ SOLN
INTRAOCULAR | Status: DC | PRN
  Administered 2020-01-28: 1 mL

## 2020-01-28 MED ORDER — NA HYALUR & NA CHOND-NA HYALUR 0.4-0.35 ML IO KIT
PACK | INTRAOCULAR | Status: DC | PRN
  Administered 2020-01-28: 1 mL via INTRAOCULAR

## 2020-01-28 MED ORDER — FENTANYL CITRATE (PF) 100 MCG/2ML IJ SOLN
INTRAMUSCULAR | Status: DC | PRN
  Administered 2020-01-28 (×2): 50 ug via INTRAVENOUS

## 2020-01-28 MED ORDER — ACETAMINOPHEN 325 MG PO TABS: 325.0000 mg | ORAL_TABLET | ORAL | Status: DC | PRN

## 2020-01-28 MED ORDER — BRIMONIDINE TARTRATE-TIMOLOL 0.2-0.5 % OP SOLN
OPHTHALMIC | Status: DC | PRN
  Administered 2020-01-28: 1 [drp] via OPHTHALMIC

## 2020-01-28 MED ORDER — CEFUROXIME OPHTHALMIC INJECTION 1 MG/0.1 ML
INJECTION | OPHTHALMIC | Status: DC | PRN
  Administered 2020-01-28: 0.1 mL via INTRACAMERAL

## 2020-01-28 MED ORDER — ACETAMINOPHEN 160 MG/5ML PO SOLN: 325.0000 mg | ORAL | Status: DC | PRN

## 2020-01-28 MED ORDER — MOXIFLOXACIN HCL 0.5 % OP SOLN
1.0000 [drp] | OPHTHALMIC | Status: DC | PRN
  Administered 2020-01-28 (×3): 1 [drp] via OPHTHALMIC

## 2020-01-28 SURGICAL SUPPLY — 29 items
CANNULA ANT/CHMB 27G (MISCELLANEOUS) ×1 IMPLANT
CANNULA ANT/CHMB 27GA (MISCELLANEOUS) ×2 IMPLANT
GLOVE SURG LX 7.5 STRW (GLOVE) ×2
GLOVE SURG LX STRL 7.5 STRW (GLOVE) ×1 IMPLANT
GLOVE SURG TRIUMPH 8.0 PF LTX (GLOVE) ×2 IMPLANT
GOWN STRL REUS W/ TWL LRG LVL3 (GOWN DISPOSABLE) ×2 IMPLANT
GOWN STRL REUS W/TWL LRG LVL3 (GOWN DISPOSABLE) ×2
LENS IOL DIOP 22.0 (Intraocular Lens) ×2 IMPLANT
LENS IOL TECNIS MONO 22.0 (Intraocular Lens) IMPLANT
MARKER SKIN DUAL TIP RULER LAB (MISCELLANEOUS) ×2 IMPLANT
NDL CAPSULORHEX 25GA (NEEDLE) ×1 IMPLANT
NDL FILTER BLUNT 18X1 1/2 (NEEDLE) ×2 IMPLANT
NDL RETROBULBAR .5 NSTRL (NEEDLE) IMPLANT
NEEDLE CAPSULORHEX 25GA (NEEDLE) ×2 IMPLANT
NEEDLE FILTER BLUNT 18X 1/2SAF (NEEDLE) ×2
NEEDLE FILTER BLUNT 18X1 1/2 (NEEDLE) ×2 IMPLANT
PACK CATARACT BRASINGTON (MISCELLANEOUS) ×2 IMPLANT
PACK EYE AFTER SURG (MISCELLANEOUS) ×2 IMPLANT
PACK OPTHALMIC (MISCELLANEOUS) ×2 IMPLANT
RING MALYGIN 7.0 (MISCELLANEOUS) IMPLANT
SOLUTION OPHTHALMIC SALT (MISCELLANEOUS) ×2 IMPLANT
SUT ETHILON 10-0 CS-B-6CS-B-6 (SUTURE)
SUT VICRYL  9 0 (SUTURE)
SUT VICRYL 9 0 (SUTURE) IMPLANT
SUTURE EHLN 10-0 CS-B-6CS-B-6 (SUTURE) IMPLANT
SYR 3ML LL SCALE MARK (SYRINGE) ×4 IMPLANT
SYR TB 1ML LUER SLIP (SYRINGE) ×2 IMPLANT
WATER STERILE IRR 250ML POUR (IV SOLUTION) ×2 IMPLANT
WIPE NON LINTING 3.25X3.25 (MISCELLANEOUS) ×2 IMPLANT

## 2020-01-28 NOTE — Transfer of Care (Signed)
Immediate Anesthesia Transfer of Care Note  Patient: Tiffany Jarvis  Procedure(s) Performed: CATARACT EXTRACTION PHACO AND INTRAOCULAR LENS PLACEMENT (IOC) LEFT (Left Eye)  Patient Location: PACU  Anesthesia Type: MAC  Level of Consciousness: awake, alert  and patient cooperative  Airway and Oxygen Therapy: Patient Spontanous Breathing and Patient connected to supplemental oxygen  Post-op Assessment: Post-op Vital signs reviewed, Patient's Cardiovascular Status Stable, Respiratory Function Stable, Patent Airway and No signs of Nausea or vomiting  Post-op Vital Signs: Reviewed and stable  Complications: No complications documented.

## 2020-01-28 NOTE — Anesthesia Preprocedure Evaluation (Signed)
Anesthesia Evaluation  Patient identified by MRN, date of birth, ID band Patient awake    Reviewed: Allergy & Precautions, H&P , NPO status , Patient's Chart, lab work & pertinent test results, reviewed documented beta blocker date and time   Airway Mallampati: II  TM Distance: >3 FB Neck ROM: full    Dental no notable dental hx.    Pulmonary sleep apnea , former smoker,    Pulmonary exam normal breath sounds clear to auscultation       Cardiovascular Exercise Tolerance: Good hypertension, Normal cardiovascular exam Rhythm:regular Rate:Normal     Neuro/Psych negative neurological ROS  negative psych ROS   GI/Hepatic negative GI ROS, Neg liver ROS,   Endo/Other  negative endocrine ROS  Renal/GU negative Renal ROS  negative genitourinary   Musculoskeletal   Abdominal   Peds  Hematology negative hematology ROS (+)   Anesthesia Other Findings   Reproductive/Obstetrics negative OB ROS                             Anesthesia Physical Anesthesia Plan  ASA: II  Anesthesia Plan: MAC   Post-op Pain Management:    Induction:   PONV Risk Score and Plan:   Airway Management Planned:   Additional Equipment:   Intra-op Plan:   Post-operative Plan:   Informed Consent: I have reviewed the patients History and Physical, chart, labs and discussed the procedure including the risks, benefits and alternatives for the proposed anesthesia with the patient or authorized representative who has indicated his/her understanding and acceptance.     Dental Advisory Given  Plan Discussed with: CRNA and Anesthesiologist  Anesthesia Plan Comments:         Anesthesia Quick Evaluation

## 2020-01-28 NOTE — Anesthesia Postprocedure Evaluation (Signed)
Anesthesia Post Note  Patient: Tiffany Jarvis  Procedure(s) Performed: CATARACT EXTRACTION PHACO AND INTRAOCULAR LENS PLACEMENT (IOC) LEFT (Left Eye)     Patient location during evaluation: PACU Anesthesia Type: MAC Level of consciousness: awake and alert Pain management: pain level controlled Vital Signs Assessment: post-procedure vital signs reviewed and stable Respiratory status: spontaneous breathing, nonlabored ventilation, respiratory function stable and patient connected to nasal cannula oxygen Cardiovascular status: stable and blood pressure returned to baseline Postop Assessment: no apparent nausea or vomiting Anesthetic complications: no   No complications documented.  Trecia Rogers

## 2020-01-28 NOTE — H&P (Signed)

## 2020-01-28 NOTE — Anesthesia Procedure Notes (Signed)
Procedure Name: MAC Date/Time: 01/28/2020 10:49 AM Performed by: Cameron Ali, CRNA Pre-anesthesia Checklist: Patient identified, Emergency Drugs available, Suction available, Timeout performed and Patient being monitored Patient Re-evaluated:Patient Re-evaluated prior to induction Oxygen Delivery Method: Nasal cannula Placement Confirmation: positive ETCO2

## 2020-01-28 NOTE — Op Note (Signed)
OPERATIVE NOTE  Tiffany Jarvis [id number] 01/28/2020   PREOPERATIVE DIAGNOSIS:  Nuclear sclerotic cataract left eye. H25.12   POSTOPERATIVE DIAGNOSIS:    Nuclear sclerotic cataract left eye.     PROCEDURE:  Phacoemusification with posterior chamber intraocular lens placement of the left eye  Ultrasound time: Procedure(s) with comments: CATARACT EXTRACTION PHACO AND INTRAOCULAR LENS PLACEMENT (IOC) LEFT (Left) - 5.61 0:53.0 10.6%  LENS:   Implant Name Type Inv. Item Serial No. Manufacturer Lot No. LRB No. Used Action  LENS IOL DIOP 22.0 - [id number] Intraocular Lens LENS IOL DIOP 22.0 [id number] [employer]  Left 1 Implanted      SURGEON:  Wyonia Hough, MD   ANESTHESIA:  Topical with tetracaine drops and 2% Xylocaine jelly, augmented with 1% preservative-free intracameral lidocaine.    COMPLICATIONS:  None.   DESCRIPTION OF PROCEDURE:  The patient was identified in the holding room and transported to the operating room and placed in the supine position under the operating microscope.  The left eye was identified as the operative eye and it was prepped and draped in the usual sterile ophthalmic fashion.   A 1 millimeter clear-corneal paracentesis was made at the 1:30 position.  0.5 ml of preservative-free 1% lidocaine was injected into the anterior chamber.  The anterior chamber was filled with Viscoat viscoelastic.  A 2.4 millimeter keratome was used to make a near-clear corneal incision at the 10:30 position.  .  A curvilinear capsulorrhexis was made with a cystotome and capsulorrhexis forceps.  Balanced salt solution was used to hydrodissect and hydrodelineate the nucleus.   Phacoemulsification was then used in stop and chop fashion to remove the lens nucleus and epinucleus.  The remaining cortex was then removed using the irrigation and aspiration handpiece. Provisc was then placed into the capsular bag to distend it for lens placement.  A lens was then  injected into the capsular bag.  The remaining viscoelastic was aspirated.   Wounds were hydrated with balanced salt solution.  The anterior chamber was inflated to a physiologic pressure with balanced salt solution.  No wound leaks were noted. Cefuroxime 0.1 ml of a 10mg /ml solution was injected into the anterior chamber for a dose of 1 mg of intracameral antibiotic at the completion of the case.   Timolol and Brimonidine drops were applied to the eye.  The patient was taken to the recovery room in stable condition without complications of anesthesia or surgery.  Tiffany Jarvis 01/28/2020, 11:13 AM

## 2020-01-29 ENCOUNTER — Encounter: Payer: Self-pay | Admitting: Ophthalmology

## 2020-02-18 ENCOUNTER — Encounter: Payer: Self-pay | Admitting: Ophthalmology

## 2020-02-18 ENCOUNTER — Other Ambulatory Visit: Payer: Self-pay

## 2020-02-23 ENCOUNTER — Other Ambulatory Visit: Admission: RE | Admit: 2020-02-23 | Payer: Medicare Other | Source: Ambulatory Visit

## 2020-02-27 ENCOUNTER — Other Ambulatory Visit: Payer: Self-pay

## 2020-02-27 ENCOUNTER — Encounter: Payer: Self-pay | Admitting: Pulmonary Disease

## 2020-02-27 ENCOUNTER — Ambulatory Visit: Payer: Medicare Other | Admitting: Pulmonary Disease

## 2020-02-27 VITALS — BP 122/76 | HR 85 | Temp 96.6°F | Ht 63.0 in | Wt 193.6 lb

## 2020-02-27 DIAGNOSIS — G4733 Obstructive sleep apnea (adult) (pediatric): Secondary | ICD-10-CM | POA: Diagnosis not present

## 2020-02-27 NOTE — Progress Notes (Signed)
[facility]  Chief Complaint  Patient presents with  . sleep consult    per Dr. Judeth Cornfield sleep study around 2011. unable to tolerate cpap. c/o loud snorning and occ sob during sleep x10y.     Constitutional:  BP 122/76 (BP Location: Left Arm, Cuff Size: Normal)   Pulse 85   Temp (!) 96.6 F (35.9 C) (Temporal)   Ht 5\' 3"  (1.6 m)   Wt 193 lb 9.6 oz (87.8 kg)   SpO2 95%   BMI 34.29 kg/m   Past Medical History:  Allergies, Breast cancer, Colon polyps, Depression, HLD, HTN, Vertigo  Summary:  Tiffany Jarvis is a 69 y.o. female former smoker with obstructive sleep apnea.  Subjective:   She had sleep study in 2011.  Found to have severe OSA.  Tried CPAP, but didn't like mask and felt confined by tubing.  Tried oral appliance, but wasn't effective.  Her son is an ER physician.  He noticed her still snoring and having apnea.  He expressed concern about her sleep and how this could impact her health.  As a result she is revisiting issue of sleep apnea.  She goes to sleep at 9 pm.  She falls asleep in about 30 minutes.  She wakes up 1 or 2 times to use the bathroom.  She gets out of bed at 615 to walk her dog.  She feels tired in the morning.  She denies morning headache.  She does not use anything to help her fall sleep or stay awake.  She denies sleep walking, sleep talking, bruxism, or nightmares.  There is no history of restless legs.  She denies sleep hallucinations, sleep paralysis, or cataplexy.  The Epworth score is 10 out of 24.  Physical Exam:   Appearance - well kempt  ENMT - no sinus tenderness, no nasal discharge, no oral exudate, Mallampati 4  Respiratory - no wheeze, or rales  CV - regular rate and rhythm, no murmurs  GI - soft, non tender  Lymph - no adenopathy noted in neck  Ext - no edema  Skin - no rashes  Neuro - normal strength, oriented x 3  Psych - normal mood and affect  Discussion:  She has snoring,  sleep disruption, apnea, and daytime sleepiness.  She has history of hypertension, depression, and prior history of obstructive sleep apnea.  I am concerned she could still have sleep apnea.  Assessment/Plan:   Snoring with excessive daytime sleepiness. - will need to arrange for a home sleep study  Obesity. - discussed how weight can impact sleep and risk for sleep disordered breathing - discussed options to assist with weight loss: combination of diet modification, cardiovascular and strength training exercises  Cardiovascular risk. - had an extensive discussion regarding the adverse health consequences related to untreated sleep disordered breathing - specifically discussed the risks for hypertension, coronary artery disease, cardiac dysrhythmias, cerebrovascular disease, and diabetes - lifestyle modification discussed  Safe driving practices. - discussed how sleep disruption can increase risk of accidents, particularly when driving - safe driving practices were discussed  Therapies for obstructive sleep apnea. - if the sleep study shows significant sleep apnea, then various therapies for treatment were reviewed: CPAP, oral appliance, and surgical interventions  A total of  31 minutes spent addressing patient care issues on day of visit.  Follow up:  Patient Instructions  Will arrange for home sleep study Will call to arrange for follow up after sleep study reviewed  Signature:  Chesley Mires, MD [place] Pager: [phone] 02/27/2020, 9:34 AM  Flow Sheet    Sleep tests:   PSG 02/11/10 >> AHI 35, SpO2 low 75%  Medications:   Allergies as of 02/27/2020   No Known Allergies     Medication List       Accurate as of February 27, 2020  9:34 AM. If you have any questions, ask your nurse or doctor.        amLODipine 2.5 MG tablet Commonly known as: NORVASC Take 1 tablet (2.5 mg total) by mouth daily.   aspirin 81 MG tablet Take 81 mg by  mouth daily.   CALCIUM 600 PO Take 1 tablet by mouth daily.   FISH OIL PO Take by mouth.   JOINT HEALTH PO Take by mouth.   lisinopril-hydrochlorothiazide 20-12.5 MG tablet Commonly known as: Zestoretic Take 2 tablets by mouth daily. In am   MULTI-VITAMIN DAILY PO Multi Vitamin   pravastatin 40 MG tablet Commonly known as: PRAVACHOL Take 1 tablet (40 mg total) by mouth at bedtime.   venlafaxine XR 150 MG 24 hr capsule Commonly known as: Effexor XR Take 1 capsule (150 mg total) by mouth daily with breakfast.   VITAMIN D3 COMPLETE PO Take 2,000 Units by mouth daily.       Past Surgical History:  She  has a past surgical history that includes Cesarean section; Breast surgery; Mastectomy (Left, 2003); Breast biopsy (Left, 2003); Reduction mammaplasty (Right); Cataract extraction w/PHACO (Left, 01/28/2020); Trigger finger release; Tonsillectomy; Ovarian cyst removal (Right); Colonoscopy; and Eye surgery (Left, 07/2019).  Family History:  Her family history includes Breast cancer in her cousin, cousin, cousin, cousin, and sister; Cancer in her cousin, cousin, cousin, cousin, mother, and sister.  Social History:  She  reports that she quit smoking about 51 years ago. She has a 0.75 pack-year smoking history. She has never used smokeless tobacco. She reports current alcohol use of about 1.0 standard drink of alcohol per week. She reports previous drug use.

## 2020-02-27 NOTE — Patient Instructions (Signed)
Will arrange for home sleep study Will call to arrange for follow up after sleep study reviewed  

## 2020-03-01 ENCOUNTER — Other Ambulatory Visit
Admission: RE | Admit: 2020-03-01 | Discharge: 2020-03-01 | Disposition: A | Discharge status: Home / Self Care | Payer: Medicare Other | Source: Ambulatory Visit | Attending: Ophthalmology | Admitting: Ophthalmology

## 2020-03-01 ENCOUNTER — Other Ambulatory Visit: Payer: Self-pay

## 2020-03-01 DIAGNOSIS — Z01812 Encounter for preprocedural laboratory examination: Secondary | ICD-10-CM | POA: Insufficient documentation

## 2020-03-01 DIAGNOSIS — Z20822 Contact with and (suspected) exposure to covid-19: Secondary | ICD-10-CM | POA: Diagnosis not present

## 2020-03-01 LAB — SARS CORONAVIRUS 2 (TAT 6-24 HRS): SARS Coronavirus 2: NEGATIVE

## 2020-03-01 NOTE — Discharge Instructions (Signed)

## 2020-03-03 ENCOUNTER — Encounter
Admission: RE | Disposition: A | Discharge status: Home / Self Care | Payer: Self-pay | Source: Home / Self Care | Attending: Ophthalmology

## 2020-03-03 ENCOUNTER — Other Ambulatory Visit: Payer: Self-pay

## 2020-03-03 ENCOUNTER — Ambulatory Visit: Payer: Medicare Other | Admitting: Anesthesiology

## 2020-03-03 ENCOUNTER — Ambulatory Visit
Admission: RE | Admit: 2020-03-03 | Discharge: 2020-03-03 | Disposition: A | Discharge status: Home / Self Care | Payer: Medicare Other | Attending: Ophthalmology | Admitting: Ophthalmology

## 2020-03-03 ENCOUNTER — Encounter: Payer: Self-pay | Admitting: Ophthalmology

## 2020-03-03 DIAGNOSIS — Z79899 Other long term (current) drug therapy: Secondary | ICD-10-CM | POA: Diagnosis not present

## 2020-03-03 DIAGNOSIS — Z6833 Body mass index (BMI) 33.0-33.9, adult: Secondary | ICD-10-CM | POA: Diagnosis not present

## 2020-03-03 DIAGNOSIS — Z87891 Personal history of nicotine dependence: Secondary | ICD-10-CM | POA: Diagnosis not present

## 2020-03-03 DIAGNOSIS — E669 Obesity, unspecified: Secondary | ICD-10-CM | POA: Diagnosis not present

## 2020-03-03 DIAGNOSIS — Z86000 Personal history of in-situ neoplasm of breast: Secondary | ICD-10-CM | POA: Diagnosis not present

## 2020-03-03 DIAGNOSIS — H2511 Age-related nuclear cataract, right eye: Secondary | ICD-10-CM | POA: Diagnosis present

## 2020-03-03 DIAGNOSIS — I10 Essential (primary) hypertension: Secondary | ICD-10-CM | POA: Insufficient documentation

## 2020-03-03 DIAGNOSIS — F329 Major depressive disorder, single episode, unspecified: Secondary | ICD-10-CM | POA: Insufficient documentation

## 2020-03-03 DIAGNOSIS — G4730 Sleep apnea, unspecified: Secondary | ICD-10-CM | POA: Diagnosis not present

## 2020-03-03 DIAGNOSIS — E785 Hyperlipidemia, unspecified: Secondary | ICD-10-CM | POA: Insufficient documentation

## 2020-03-03 DIAGNOSIS — Z7982 Long term (current) use of aspirin: Secondary | ICD-10-CM | POA: Diagnosis not present

## 2020-03-03 DIAGNOSIS — F419 Anxiety disorder, unspecified: Secondary | ICD-10-CM | POA: Insufficient documentation

## 2020-03-03 HISTORY — PX: CATARACT EXTRACTION W/PHACO: SHX586

## 2020-03-03 HISTORY — DX: Depression, unspecified: F32.A

## 2020-03-03 SURGERY — PHACOEMULSIFICATION, CATARACT, WITH IOL INSERTION
Anesthesia: Monitor Anesthesia Care | Site: Eye | Laterality: Right

## 2020-03-03 MED ORDER — MOXIFLOXACIN HCL 0.5 % OP SOLN
1.0000 [drp] | OPHTHALMIC | Status: DC | PRN
  Administered 2020-03-03 (×3): 1 [drp] via OPHTHALMIC

## 2020-03-03 MED ORDER — CEFUROXIME OPHTHALMIC INJECTION 1 MG/0.1 ML
INJECTION | OPHTHALMIC | Status: DC | PRN
  Administered 2020-03-03: 0.1 mL via INTRACAMERAL

## 2020-03-03 MED ORDER — MIDAZOLAM HCL 2 MG/2ML IJ SOLN
INTRAMUSCULAR | Status: DC | PRN
  Administered 2020-03-03: 2 mg via INTRAVENOUS
  Administered 2020-03-03: 1 mg via INTRAVENOUS

## 2020-03-03 MED ORDER — LIDOCAINE HCL (PF) 2 % IJ SOLN
INTRAOCULAR | Status: DC | PRN
  Administered 2020-03-03: 1 mL

## 2020-03-03 MED ORDER — EPINEPHRINE PF 1 MG/ML IJ SOLN
INTRAOCULAR | Status: DC | PRN
  Administered 2020-03-03: 57 mL via OPHTHALMIC

## 2020-03-03 MED ORDER — FENTANYL CITRATE (PF) 100 MCG/2ML IJ SOLN
INTRAMUSCULAR | Status: DC | PRN
  Administered 2020-03-03: 100 ug via INTRAVENOUS

## 2020-03-03 MED ORDER — ARMC OPHTHALMIC DILATING DROPS
1.0000 "application " | OPHTHALMIC | Status: DC | PRN
  Administered 2020-03-03 (×3): 1 via OPHTHALMIC

## 2020-03-03 MED ORDER — NA HYALUR & NA CHOND-NA HYALUR 0.4-0.35 ML IO KIT
PACK | INTRAOCULAR | Status: DC | PRN
  Administered 2020-03-03: 1 mL via INTRAOCULAR

## 2020-03-03 MED ORDER — TETRACAINE HCL 0.5 % OP SOLN
1.0000 [drp] | OPHTHALMIC | Status: DC | PRN
  Administered 2020-03-03 (×3): 1 [drp] via OPHTHALMIC

## 2020-03-03 MED ORDER — LACTATED RINGERS IV SOLN: INTRAVENOUS | Status: DC

## 2020-03-03 MED ORDER — BRIMONIDINE TARTRATE-TIMOLOL 0.2-0.5 % OP SOLN
OPHTHALMIC | Status: DC | PRN
  Administered 2020-03-03: 1 [drp] via OPHTHALMIC

## 2020-03-03 SURGICAL SUPPLY — 29 items
CANNULA ANT/CHMB 27G (MISCELLANEOUS) ×1 IMPLANT
CANNULA ANT/CHMB 27GA (MISCELLANEOUS) ×2 IMPLANT
GLOVE SURG LX 7.5 STRW (GLOVE) ×2
GLOVE SURG LX STRL 7.5 STRW (GLOVE) ×1 IMPLANT
GLOVE SURG TRIUMPH 8.0 PF LTX (GLOVE) ×2 IMPLANT
GOWN STRL REUS W/ TWL LRG LVL3 (GOWN DISPOSABLE) ×2 IMPLANT
GOWN STRL REUS W/TWL LRG LVL3 (GOWN DISPOSABLE) ×4
LENS IOL DIOP 21.5 (Intraocular Lens) ×2 IMPLANT
LENS IOL TECNIS MONO 21.5 (Intraocular Lens) IMPLANT
MARKER SKIN DUAL TIP RULER LAB (MISCELLANEOUS) ×2 IMPLANT
NDL CAPSULORHEX 25GA (NEEDLE) ×1 IMPLANT
NDL FILTER BLUNT 18X1 1/2 (NEEDLE) ×2 IMPLANT
NDL RETROBULBAR .5 NSTRL (NEEDLE) IMPLANT
NEEDLE CAPSULORHEX 25GA (NEEDLE) ×2 IMPLANT
NEEDLE FILTER BLUNT 18X 1/2SAF (NEEDLE) ×2
NEEDLE FILTER BLUNT 18X1 1/2 (NEEDLE) ×2 IMPLANT
PACK CATARACT BRASINGTON (MISCELLANEOUS) ×2 IMPLANT
PACK EYE AFTER SURG (MISCELLANEOUS) ×2 IMPLANT
PACK OPTHALMIC (MISCELLANEOUS) ×2 IMPLANT
RING MALYGIN 7.0 (MISCELLANEOUS) IMPLANT
SOLUTION OPHTHALMIC SALT (MISCELLANEOUS) ×2 IMPLANT
SUT ETHILON 10-0 CS-B-6CS-B-6 (SUTURE)
SUT VICRYL  9 0 (SUTURE)
SUT VICRYL 9 0 (SUTURE) IMPLANT
SUTURE EHLN 10-0 CS-B-6CS-B-6 (SUTURE) IMPLANT
SYR 3ML LL SCALE MARK (SYRINGE) ×4 IMPLANT
SYR TB 1ML LUER SLIP (SYRINGE) ×2 IMPLANT
WATER STERILE IRR 250ML POUR (IV SOLUTION) ×2 IMPLANT
WIPE NON LINTING 3.25X3.25 (MISCELLANEOUS) ×2 IMPLANT

## 2020-03-03 NOTE — H&P (Signed)

## 2020-03-03 NOTE — Anesthesia Postprocedure Evaluation (Signed)
Anesthesia Post Note  Patient: Tiffany Jarvis  Procedure(s) Performed: CATARACT EXTRACTION PHACO AND INTRAOCULAR LENS PLACEMENT (IOC) RIGHT (Right Eye)     Anesthesia Post Evaluation No complications documented.  Polette Nofsinger [employer]

## 2020-03-03 NOTE — Transfer of Care (Signed)
Immediate Anesthesia Transfer of Care Note  Patient: Tiffany Jarvis  Procedure(s) Performed: CATARACT EXTRACTION PHACO AND INTRAOCULAR LENS PLACEMENT (IOC) RIGHT (Right Eye)  Patient Location: PACU  Anesthesia Type: MAC  Level of Consciousness: awake, alert  and patient cooperative  Airway and Oxygen Therapy: Patient Spontanous Breathing and Patient connected to supplemental oxygen  Post-op Assessment: Post-op Vital signs reviewed, Patient's Cardiovascular Status Stable, Respiratory Function Stable, Patent Airway and No signs of Nausea or vomiting  Post-op Vital Signs: Reviewed and stable  Complications: No complications documented.

## 2020-03-03 NOTE — Anesthesia Procedure Notes (Signed)
Procedure Name: MAC Date/Time: 03/03/2020 10:47 AM Performed by: Silvana Newness, CRNA Pre-anesthesia Checklist: Patient identified, Emergency Drugs available, Suction available, Patient being monitored and Timeout performed Patient Re-evaluated:Patient Re-evaluated prior to induction Oxygen Delivery Method: Nasal cannula Preoxygenation: Pre-oxygenation with 100% oxygen Placement Confirmation: positive ETCO2

## 2020-03-03 NOTE — Op Note (Signed)
LOCATION:  [place]   PREOPERATIVE DIAGNOSIS:    Nuclear sclerotic cataract right eye. H25.11   POSTOPERATIVE DIAGNOSIS:  Nuclear sclerotic cataract right eye.     PROCEDURE:  Phacoemusification with posterior chamber intraocular lens placement of the right eye   ULTRASOUND TIME: Procedure(s) with comments: CATARACT EXTRACTION PHACO AND INTRAOCULAR LENS PLACEMENT (IOC) RIGHT (Right) - 2.52 0:41.6 6.0%  LENS:   Implant Name Type Inv. Item Serial No. Manufacturer Lot No. LRB No. Used Action  LENS IOL DIOP 21.5 - [id number] Intraocular Lens LENS IOL DIOP 21.5 [id number] [employer]  Right 1 Implanted         SURGEON:  Wyonia Hough, MD   ANESTHESIA:  Topical with tetracaine drops and 2% Xylocaine jelly, augmented with 1% preservative-free intracameral lidocaine.    COMPLICATIONS:  None.   DESCRIPTION OF PROCEDURE:  The patient was identified in the holding room and transported to the operating room and placed in the supine position under the operating microscope.  The right eye was identified as the operative eye and it was prepped and draped in the usual sterile ophthalmic fashion.   A 1 millimeter clear-corneal paracentesis was made at the 12:00 position.  0.5 ml of preservative-free 1% lidocaine was injected into the anterior chamber. The anterior chamber was filled with Viscoat viscoelastic.  A 2.4 millimeter keratome was used to make a near-clear corneal incision at the 9:00 position.  A curvilinear capsulorrhexis was made with a cystotome and capsulorrhexis forceps.  Balanced salt solution was used to hydrodissect and hydrodelineate the nucleus.   Phacoemulsification was then used in stop and chop fashion to remove the lens nucleus and epinucleus.  The remaining cortex was then removed using the irrigation and aspiration handpiece. Provisc was then placed into the capsular bag to distend it for lens placement.  A lens was then injected into the  capsular bag.  The remaining viscoelastic was aspirated.   Wounds were hydrated with balanced salt solution.  The anterior chamber was inflated to a physiologic pressure with balanced salt solution.  No wound leaks were noted. Cefuroxime 0.1 ml of a 10mg /ml solution was injected into the anterior chamber for a dose of 1 mg of intracameral antibiotic at the completion of the case.   Timolol and Brimonidine drops were applied to the eye.  The patient was taken to the recovery room in stable condition without complications of anesthesia or surgery.   Sharelle Burditt 03/03/2020, 11:07 AM

## 2020-03-03 NOTE — Anesthesia Preprocedure Evaluation (Signed)
Anesthesia Evaluation  Patient identified by MRN, date of birth, ID band Patient awake    Reviewed: Allergy & Precautions, H&P , NPO status , Patient's Chart, lab work & pertinent test results, reviewed documented beta blocker date and time   Airway Mallampati: II  TM Distance: >3 FB Neck ROM: full    Dental no notable dental hx.    Pulmonary sleep apnea , former smoker,    Pulmonary exam normal        Cardiovascular Exercise Tolerance: Good hypertension, Normal cardiovascular exam     Neuro/Psych Anxiety Depression negative neurological ROS     GI/Hepatic negative GI ROS, Neg liver ROS,   Endo/Other  BMI 33  Renal/GU negative Renal ROS  negative genitourinary   Musculoskeletal negative musculoskeletal ROS (+)   Abdominal (+) + obese,   Peds  Hematology negative hematology ROS (+)   Anesthesia Other Findings   Reproductive/Obstetrics negative OB ROS                             Anesthesia Physical  Anesthesia Plan  ASA: II  Anesthesia Plan: MAC   Post-op Pain Management:    Induction:   PONV Risk Score and Plan: 2 and TIVA, Midazolam and Treatment may vary due to age or medical condition  Airway Management Planned: Nasal Cannula  Additional Equipment: None  Intra-op Plan:   Post-operative Plan:   Informed Consent: I have reviewed the patients History and Physical, chart, labs and discussed the procedure including the risks, benefits and alternatives for the proposed anesthesia with the patient or authorized representative who has indicated his/her understanding and acceptance.     Dental Advisory Given  Plan Discussed with: CRNA  Anesthesia Plan Comments:         Anesthesia Quick Evaluation

## 2020-03-04 ENCOUNTER — Encounter: Payer: Self-pay | Admitting: Ophthalmology

## 2020-03-08 ENCOUNTER — Other Ambulatory Visit: Payer: Self-pay | Admitting: *Deleted

## 2020-03-08 ENCOUNTER — Encounter: Payer: Self-pay | Admitting: Radiation Oncology

## 2020-03-08 DIAGNOSIS — Z86000 Personal history of in-situ neoplasm of breast: Secondary | ICD-10-CM

## 2020-03-08 DIAGNOSIS — Z1211 Encounter for screening for malignant neoplasm of colon: Secondary | ICD-10-CM

## 2020-03-17 ENCOUNTER — Other Ambulatory Visit: Payer: Self-pay

## 2020-03-17 ENCOUNTER — Telehealth (INDEPENDENT_AMBULATORY_CARE_PROVIDER_SITE_OTHER): Payer: Self-pay | Admitting: Gastroenterology

## 2020-03-17 DIAGNOSIS — Z800 Family history of malignant neoplasm of digestive organs: Secondary | ICD-10-CM

## 2020-03-17 DIAGNOSIS — Z86010 Personal history of colonic polyps: Secondary | ICD-10-CM

## 2020-03-17 NOTE — Progress Notes (Signed)
Gastroenterology Pre-Procedure Review  Request Date: Thursday 03/27/20 Requesting Physician: Dr. Allen Norris  PATIENT REVIEW QUESTIONS: The patient responded to the following health history questions as indicated:    1. Are you having any GI issues? no 2. Do you have a personal history of Polyps? yes (patient states her first colonoscopy she had colon polyps, and this is noted in her chart.)  3. Do you have a family history of Colon Cancer or Polyps? yes (mother colon cancer) 4. Diabetes Mellitus? no 5. Joint replacements in the past 12 months?no 6. Major health problems in the past 3 months?Cataract Surgery 03/03/20 7. Any artificial heart valves, MVP, or defibrillator?no    MEDICATIONS & ALLERGIES:    Patient reports the following regarding taking any anticoagulation/antiplatelet therapy:   Plavix, Coumadin, Eliquis, Xarelto, Lovenox, Pradaxa, Brilinta, or Effient? no Aspirin? yes (81 mg daily)  Patient confirms/reports the following medications:  Current Outpatient Medications  Medication Sig Dispense Refill  . amLODipine (NORVASC) 2.5 MG tablet Take 1 tablet (2.5 mg total) by mouth daily. 90 tablet 3  . aspirin 81 MG tablet Take 81 mg by mouth daily.    . Calcium Carbonate (CALCIUM 600 PO) Take 1 tablet by mouth daily.    Marland Kitchen lisinopril-hydrochlorothiazide (ZESTORETIC) 20-12.5 MG tablet Take 2 tablets by mouth daily. In am 180 tablet 3  . Misc Natural Products (JOINT HEALTH PO) Take by mouth.    . Multiple Vitamin (MULTI-VITAMIN DAILY PO) Multi Vitamin    . Multiple Vitamins-Minerals (VITAMIN D3 COMPLETE PO) Take 2,000 Units by mouth daily.    . Omega-3 Fatty Acids (FISH OIL PO) Take by mouth.    . pravastatin (PRAVACHOL) 40 MG tablet Take 1 tablet (40 mg total) by mouth at bedtime. 90 tablet 3  . venlafaxine XR (EFFEXOR XR) 150 MG 24 hr capsule Take 1 capsule (150 mg total) by mouth daily with breakfast. 90 capsule 3   No current facility-administered medications for this visit.     Patient confirms/reports the following allergies:  No Known Allergies  No orders of the defined types were placed in this encounter.   AUTHORIZATION INFORMATION Primary Insurance: 1D#: Group #:  Secondary Insurance: 1D#: Group #:  SCHEDULE INFORMATION: Date: November 11th 2021 Time: Location:[facility]

## 2020-03-24 ENCOUNTER — Other Ambulatory Visit: Payer: Self-pay

## 2020-03-24 ENCOUNTER — Ambulatory Visit: Payer: Medicare Other

## 2020-03-24 ENCOUNTER — Ambulatory Visit: Payer: Medicare Other | Admitting: Radiation Oncology

## 2020-03-24 DIAGNOSIS — G4733 Obstructive sleep apnea (adult) (pediatric): Secondary | ICD-10-CM

## 2020-03-26 ENCOUNTER — Telehealth: Payer: Self-pay | Admitting: Pulmonary Disease

## 2020-03-26 DIAGNOSIS — G4733 Obstructive sleep apnea (adult) (pediatric): Secondary | ICD-10-CM

## 2020-03-26 NOTE — Telephone Encounter (Signed)
Called and spoke with patient about home sleep study results per Dr Halford Chessman. Scheduled office visit with Dr Halford Chessman (per patient preference and request) for November 18th, 2021 at 12:00pm. All questions answered and patient expressed full understanding. Nothing further needed at this time.

## 2020-03-26 NOTE — Telephone Encounter (Signed)
HST 03/24/20 >> AHI 23, SpO2 low 76%.   Please inform her that her sleep study shows moderate obstructive sleep apnea.  Please arrange for ROV with me or NP to discuss treatment options.

## 2020-03-29 ENCOUNTER — Other Ambulatory Visit: Payer: Self-pay

## 2020-03-29 MED ORDER — SUPREP BOWEL PREP KIT 17.5-3.13-1.6 GM/177ML PO SOLN: 1.0000 | ORAL | 0 refills | Status: DC

## 2020-03-30 ENCOUNTER — Other Ambulatory Visit: Payer: Self-pay

## 2020-03-30 ENCOUNTER — Ambulatory Visit
Admission: RE | Admit: 2020-03-30 | Discharge: 2020-03-30 | Disposition: A | Discharge status: Home / Self Care | Payer: Medicare Other | Source: Ambulatory Visit | Attending: Radiation Oncology | Admitting: Radiation Oncology

## 2020-03-30 DIAGNOSIS — Z1231 Encounter for screening mammogram for malignant neoplasm of breast: Secondary | ICD-10-CM | POA: Insufficient documentation

## 2020-03-30 DIAGNOSIS — Z86000 Personal history of in-situ neoplasm of breast: Secondary | ICD-10-CM

## 2020-03-31 ENCOUNTER — Encounter: Payer: Self-pay | Admitting: Radiation Oncology

## 2020-03-31 ENCOUNTER — Ambulatory Visit
Admission: RE | Admit: 2020-03-31 | Discharge: 2020-03-31 | Disposition: A | Discharge status: Home / Self Care | Payer: Medicare Other | Source: Ambulatory Visit | Attending: Radiation Oncology | Admitting: Radiation Oncology

## 2020-03-31 ENCOUNTER — Other Ambulatory Visit: Payer: Self-pay | Admitting: *Deleted

## 2020-03-31 VITALS — BP 149/95 | HR 70 | Temp 96.0°F | Wt 190.0 lb

## 2020-03-31 DIAGNOSIS — Z923 Personal history of irradiation: Secondary | ICD-10-CM | POA: Diagnosis not present

## 2020-03-31 DIAGNOSIS — Z9010 Acquired absence of unspecified breast and nipple: Secondary | ICD-10-CM | POA: Insufficient documentation

## 2020-03-31 DIAGNOSIS — Z86000 Personal history of in-situ neoplasm of breast: Secondary | ICD-10-CM

## 2020-03-31 NOTE — Progress Notes (Signed)
Radiation Oncology Follow up Note  Name: Tiffany Jarvis   Date:   03/31/2020 MRN:  [id number] DOB: 01/22/1951    This 69 y.o. 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  She is seen today and is without complaint specifically [place] breast tenderness cough or bone pain.  She had a screening mammogram yesterday of the right breast which I have reviewed although has not been officially read.  In my opinion shows no evidence of disease.  COMPLICATIONS OF TREATMENT: none  FOLLOW UP COMPLIANCE: keeps appointments   PHYSICAL EXAM:  BP (!) 149/95 (BP Location: Right Arm, Patient Position: Sitting, Cuff Size: Normal)   Pulse 70   Temp (!) 96 F (35.6 C) (Tympanic)   Wt 190 lb (86.2 kg)   BMI 33.66 kg/m  Lungs are clear to A&P cardiac examination essentially unremarkable with regular rate and rhythm. No dominant mass or nodularity is noted in either breast in 2 positions examined. Incision is well-healed. No axillary or supraclavicular adenopathy is appreciated. Cosmetic result is excellent.  Well-developed well-nourished patient in NAD. HEENT reveals PERLA, EOMI, discs not visualized.  Oral cavity is clear. No oral mucosal lesions are identified. Neck is clear without evidence of cervical or supraclavicular adenopathy. Lungs are clear to A&P. Cardiac examination is essentially unremarkable with regular rate and rhythm without murmur rub or thrill. Abdomen is benign with no organomegaly or masses noted. Motor sensory and DTR levels are equal and symmetric in the upper and lower extremities. Cranial nerves II through XII are grossly intact.  Proprioception is intact. No peripheral adenopathy or edema is identified. No motor or sensory levels are noted. Crude visual fields are within normal range.  1  RADIOLOGY RESULTS: Unilateral right mammogram reviewed compatible with above-stated findings.  Formal report will be issued in the next day or 2 which I will review  PLAN: Present time patient continues to do well with no evidence of disease.  Of asked to see her back in 1 year for follow-up.  She will also have a 3D screening unilateral right breast mammogram prior to that visit.  Patient comprehends my treatment plan well.  Patient knows to call with any concerns.  I would like to take this opportunity to thank you for allowing me to participate in the care of your patient.Noreene Filbert, MD

## 2020-04-06 ENCOUNTER — Encounter: Payer: Self-pay | Admitting: Adult Health

## 2020-04-06 ENCOUNTER — Other Ambulatory Visit: Payer: Self-pay

## 2020-04-06 ENCOUNTER — Ambulatory Visit: Payer: Medicare Other | Admitting: Adult Health

## 2020-04-06 VITALS — BP 132/74 | HR 69 | Temp 97.0°F | Ht 63.0 in | Wt 192.0 lb

## 2020-04-06 DIAGNOSIS — E669 Obesity, unspecified: Secondary | ICD-10-CM | POA: Diagnosis not present

## 2020-04-06 DIAGNOSIS — G4733 Obstructive sleep apnea (adult) (pediatric): Secondary | ICD-10-CM | POA: Diagnosis not present

## 2020-04-06 NOTE — Progress Notes (Signed)
_0  ID: Tiffany Jarvis, female    DOB: 10-Jul-1951, 69 y.o.   MRN: [id number]  Chief Complaint  Patient presents with  . Follow-up    OSA     Referring provider: Crecencio Mc, MD  HPI: 69 year old female seen for sleep consult August 2021.  Patient had previously been diagnosed with sleep apnea in the past but was intolerant to CPAP.  She has restless sleep and significant snoring.  Patient was set up for a home sleep study that confirmed she continues to have moderate sleep apnea with nocturnal hypoxemia  TEST/EVENTS :  Home sleep study 03/24/2020 AHI 23/hour, SPO2 low 76%  04/06/2020 Follow-up visit Patient presents for 1 month follow-up.  Patient was seen last visit for a sleep consult for sleep apnea.  Patient had been diagnosed around age 36 with sleep apnea.  She was started on CPAP but was unable to tolerate.  Patient felt that the CPAP was not comfortable and was loud.  Patient says she continued to have ongoing symptoms.  Mainly with restless sleep and significant snoring.  Patient was set up for a home sleep study performed on March 24, 2020.  This showed moderate sleep apnea with an AHI at 23/hour and SPO2 low at 76%.  We discussed her test results and went over underlying sleep apnea with potential complications of untreated sleep apnea including cardiovascular risk. Patient would like to proceed and retry CPAP.  We looked at mask options and she would like to try the DreamWear nasal mask.   No Known Allergies  Immunization History  Administered Date(s) Administered  . Fluad Quad(high Dose 65+) 03/06/2019  . Influenza, Seasonal, Injecte, Preservative Fre 04/30/2013, 05/18/2014, 05/18/2015, 05/18/2017  . Influenza,inj,Quad PF,6+ Mos 05/23/2015, 05/10/2016, 03/14/2018  . Influenza,inj,quad, With Preservative 03/27/2018  . PFIZER SARS-COV-2 Vaccination 08/08/2019, 08/29/2019  . Pneumococcal Conjugate-13 05/27/2019  . Tdap 09/05/2010  . Zoster Recombinat (Shingrix)  04/01/2018, 06/05/2018    Past Medical History:  Diagnosis Date  . Allergy   . 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 Colon polyps   . Depression   . History of chicken pox   . Hyperlipidemia   . Hypertension   . OSA (obstructive sleep apnea)    not able to tolerate cpap or dental mouth piece   . Vertigo    CT neck and head negative significant CAS (1 episode - "yrs ago")    Tobacco History: Social History   Tobacco Use  Smoking Status Former Smoker  . Packs/day: 0.25  . Years: 3.00  . Pack years: 0.75  . Quit date: 5  . Years since quitting: 51.7  Smokeless Tobacco Never Used  Tobacco Comment   former smoker x 3 years 19-22 light    Counseling given: Not Answered Comment: former smoker x 3 years 19-22 light    Outpatient Medications Prior to Visit  Medication Sig Dispense Refill  . amLODipine (NORVASC) 2.5 MG tablet Take 1 tablet (2.5 mg total) by mouth daily. 90 tablet 3  . aspirin 81 MG tablet Take 81 mg by mouth daily.    . Calcium Carbonate (CALCIUM 600 PO) Take 1 tablet by mouth daily.    Marland Kitchen lisinopril-hydrochlorothiazide (ZESTORETIC) 20-12.5 MG tablet Take 2 tablets by mouth daily. In am 180 tablet 3  . Misc Natural Products (JOINT HEALTH PO) Take by mouth.    . Multiple Vitamin (MULTI-VITAMIN DAILY PO) Multi Vitamin    . Multiple Vitamins-Minerals (VITAMIN D3  COMPLETE PO) Take 2,000 Units by mouth daily.    . Na Sulfate-K Sulfate-Mg Sulf (SUPREP BOWEL PREP KIT) 17.5-3.13-1.6 GM/177ML SOLN Take 1 kit by mouth as directed. 354 mL 0  . Omega-3 Fatty Acids (FISH OIL PO) Take by mouth.    . pravastatin (PRAVACHOL) 40 MG tablet Take 1 tablet (40 mg total) by mouth at bedtime. 90 tablet 3  . venlafaxine XR (EFFEXOR XR) 150 MG 24 hr capsule Take 1 capsule (150 mg total) by mouth daily with breakfast. 90 capsule 3   No facility-administered medications prior to visit.     Review of Systems:   Constitutional:   No  weight loss, night sweats,   Fevers, chills, fatigue, or  lassitude.  HEENT:   No headaches,  Difficulty swallowing,  Tooth/dental problems, or  Sore throat,                No sneezing, itching, ear ache, nasal congestion, post nasal drip,   CV:  No chest pain,  Orthopnea, PND, swelling in lower extremities, anasarca, dizziness, palpitations, syncope.   GI  No heartburn, indigestion, abdominal pain, nausea, vomiting, diarrhea, change in bowel habits, loss of appetite, bloody stools.   Resp: No shortness of breath with exertion or at rest.  No excess mucus, no productive cough,  No non-productive cough,  No coughing up of blood.  No change in color of mucus.  No wheezing.  No chest wall deformity  Skin: no rash or lesions.  GU: no dysuria, change in color of urine, no urgency or frequency.  No flank pain, no hematuria   MS:  No joint pain or swelling.  No decreased range of motion.  No back pain.    Physical Exam  BP 132/74 (BP Location: Left Arm, Cuff Size: Normal)   Pulse 69   Temp (!) 97 F (36.1 C) (Temporal)   Ht $R'5\' 3"'fV$  (1.6 m)   Wt 192 lb (87.1 kg)   SpO2 100% Comment: RA  BMI 34.01 kg/m   GEN: A/Ox3; pleasant , NAD, well nourished    HEENT:  Falls/AT,   NOSE-clear, THROAT-clear, no lesions, no postnasal drip or exudate noted.  Class II NP airway  NECK:  Supple w/ fair ROM; no JVD; normal carotid impulses w/o bruits; no thyromegaly or nodules palpated; no lymphadenopathy.    RESP  Clear  P & A; w/o, wheezes/ rales/ or rhonchi. no accessory muscle use, no dullness to percussion  CARD:  RRR, no m/r/g, no peripheral edema, pulses intact, no cyanosis or clubbing.  GI:   Soft & nt; nml bowel sounds; no organomegaly or masses detected.   Musco: Warm bil, no deformities or joint swelling noted.   Neuro: alert, no focal deficits noted.    Skin: Warm, no lesions or rashes    Lab Results:  CBC  BNP No results found for: BNP  ProBNP No results found for: PROBNP  Imaging:    No flowsheet  data found.  No results found for: NITRICOXIDE      Assessment & Plan:   Sleep apnea Moderate obstructive sleep apnea.  Patient education on sleep apnea and treatment options.  Patient would like proceed with CPAP.  She would like to try a DreamWear nasal mask.  Plan  Patient Instructions  Begin CPAP At bedtime   May try dreamwear nasal mask .  Wear all night long for at least 4-6 hrs or more each night .  Work on healthy weight  Do not drive  if sleepy .  Follow up in 2-3 months and As needed         Obesity (BMI 30-39.9) Recommended on healthy weight loss     Rexene Edison, NP 04/06/2020

## 2020-04-06 NOTE — Assessment & Plan Note (Signed)
Moderate obstructive sleep apnea.  Patient education on sleep apnea and treatment options.  Patient would like proceed with CPAP.  She would like to try a DreamWear nasal mask.  Plan  Patient Instructions  Begin CPAP At bedtime   May try dreamwear nasal mask .  Wear all night long for at least 4-6 hrs or more each night .  Work on healthy weight  Do not drive if sleepy .  Follow up in 2-3 months and As needed

## 2020-04-06 NOTE — Patient Instructions (Addendum)
Begin CPAP At bedtime   May try dreamwear nasal mask .  Wear all night long for at least 4-6 hrs or more each night .  Work on healthy weight  Do not drive if sleepy .  Follow up in 2-3 months and As needed

## 2020-04-06 NOTE — Assessment & Plan Note (Signed)
Recommended on healthy weight loss

## 2020-04-07 NOTE — Progress Notes (Signed)
Reviewed and agree with assessment/plan.   Chesley Mires, MD [facility] Pulmonary/Critical Care 04/07/2020, 9:12 AM Pager:  [phone]

## 2020-04-12 ENCOUNTER — Other Ambulatory Visit: Payer: Self-pay

## 2020-04-12 ENCOUNTER — Encounter: Payer: Self-pay | Admitting: Gastroenterology

## 2020-04-15 ENCOUNTER — Other Ambulatory Visit: Payer: Self-pay

## 2020-04-15 ENCOUNTER — Other Ambulatory Visit
Admission: RE | Admit: 2020-04-15 | Discharge: 2020-04-15 | Disposition: A | Discharge status: Home / Self Care | Payer: Medicare Other | Source: Ambulatory Visit | Attending: Gastroenterology | Admitting: Gastroenterology

## 2020-04-15 DIAGNOSIS — Z01812 Encounter for preprocedural laboratory examination: Secondary | ICD-10-CM | POA: Diagnosis present

## 2020-04-15 DIAGNOSIS — Z20822 Contact with and (suspected) exposure to covid-19: Secondary | ICD-10-CM | POA: Insufficient documentation

## 2020-04-15 LAB — SARS CORONAVIRUS 2 (TAT 6-24 HRS): SARS Coronavirus 2: NEGATIVE

## 2020-04-15 NOTE — Discharge Instructions (Signed)
General Anesthesia, Adult, Care After This sheet gives you information about how to care for yourself after your procedure. Your health care provider may also give you more specific instructions. If you have problems or questions, contact your health care provider. What can I expect after the procedure? After the procedure, the following side effects are common:  Pain or discomfort at the IV site.  Nausea.  Vomiting.  Sore throat.  Trouble concentrating.  Feeling cold or chills.  Weak or tired.  Sleepiness and fatigue.  Soreness and body aches. These side effects can affect parts of the body that were not involved in surgery. Follow these instructions at home:  For at least 24 hours after the procedure:  Have a responsible adult stay with you. It is important to have someone help care for you until you are awake and alert.  Rest as needed.  Do not: ? Participate in activities in which you could fall or become injured. ? Drive. ? Use heavy machinery. ? Drink alcohol. ? Take sleeping pills or medicines that cause drowsiness. ? Make important decisions or sign legal documents. ? Take care of children on your own. Eating and drinking  Follow any instructions from your health care provider about eating or drinking restrictions.  When you feel hungry, start by eating small amounts of foods that are soft and easy to digest (bland), such as toast. Gradually return to your regular diet.  Drink enough fluid to keep your urine pale yellow.  If you vomit, rehydrate by drinking water, juice, or clear broth. General instructions  If you have sleep apnea, surgery and certain medicines can increase your risk for breathing problems. Follow instructions from your health care provider about wearing your sleep device: ? Anytime you are sleeping, including during daytime naps. ? While taking prescription pain medicines, sleeping medicines, or medicines that make you drowsy.  Return to  your normal activities as told by your health care provider. Ask your health care provider what activities are safe for you.  Take over-the-counter and prescription medicines only as told by your health care provider.  If you smoke, do not smoke without supervision.  Keep all follow-up visits as told by your health care provider. This is important. Contact a health care provider if:  You have nausea or vomiting that does not get better with medicine.  You cannot eat or drink without vomiting.  You have pain that does not get better with medicine.  You are unable to pass urine.  You develop a skin rash.  You have a fever.  You have redness around your IV site that gets worse. Get help right away if:  You have difficulty breathing.  You have chest pain.  You have blood in your urine or stool, or you vomit blood. Summary  After the procedure, it is common to have a sore throat or nausea. It is also common to feel tired.  Have a responsible adult stay with you for the first 24 hours after general anesthesia. It is important to have someone help care for you until you are awake and alert.  When you feel hungry, start by eating small amounts of foods that are soft and easy to digest (bland), such as toast. Gradually return to your regular diet.  Drink enough fluid to keep your urine pale yellow.  Return to your normal activities as told by your health care provider. Ask your health care provider what activities are safe for you. This information is not   intended to replace advice given to you by your health care provider. Make sure you discuss any questions you have with your health care provider. Document Revised: 07/06/2017 Document Reviewed: 02/16/2017 Elsevier Patient Education  2020 Elsevier Inc.  

## 2020-04-16 ENCOUNTER — Other Ambulatory Visit: Payer: Medicare Other

## 2020-04-16 ENCOUNTER — Telehealth: Payer: Self-pay | Admitting: Pulmonary Disease

## 2020-04-16 NOTE — Telephone Encounter (Addendum)
Called Brad at [facility].  He states order was sent to pap schedulers on 9/27 and if pt hasn't heard from them by 10/8 for her to call customer service and can ask for pap schedulers to check status.  I called pt & gave her this info.  She states ok.  Nothing further needed.

## 2020-04-18 ENCOUNTER — Other Ambulatory Visit: Payer: Self-pay | Admitting: Gastroenterology

## 2020-04-18 MED ORDER — SUTAB 1479-225-188 MG PO TABS: 12.0000 | ORAL_TABLET | Freq: Two times a day (BID) | ORAL | 0 refills | Status: DC

## 2020-04-19 ENCOUNTER — Encounter: Payer: Self-pay | Admitting: Gastroenterology

## 2020-04-19 ENCOUNTER — Other Ambulatory Visit: Payer: Self-pay

## 2020-04-19 ENCOUNTER — Ambulatory Visit: Payer: Medicare Other | Admitting: Anesthesiology

## 2020-04-19 ENCOUNTER — Encounter
Admission: RE | Disposition: A | Discharge status: Home / Self Care | Payer: Self-pay | Source: Home / Self Care | Attending: Gastroenterology

## 2020-04-19 ENCOUNTER — Ambulatory Visit
Admission: RE | Admit: 2020-04-19 | Discharge: 2020-04-19 | Disposition: A | Discharge status: Home / Self Care | Payer: Medicare Other | Attending: Gastroenterology | Admitting: Gastroenterology

## 2020-04-19 DIAGNOSIS — K635 Polyp of colon: Secondary | ICD-10-CM | POA: Diagnosis not present

## 2020-04-19 DIAGNOSIS — K640 First degree hemorrhoids: Secondary | ICD-10-CM | POA: Insufficient documentation

## 2020-04-19 DIAGNOSIS — Z860100 Personal history of colon polyps, unspecified: Secondary | ICD-10-CM

## 2020-04-19 DIAGNOSIS — K5730 Diverticulosis of large intestine without perforation or abscess without bleeding: Secondary | ICD-10-CM | POA: Insufficient documentation

## 2020-04-19 DIAGNOSIS — Z1211 Encounter for screening for malignant neoplasm of colon: Secondary | ICD-10-CM | POA: Diagnosis present

## 2020-04-19 DIAGNOSIS — D123 Benign neoplasm of transverse colon: Secondary | ICD-10-CM | POA: Diagnosis not present

## 2020-04-19 DIAGNOSIS — Z803 Family history of malignant neoplasm of breast: Secondary | ICD-10-CM | POA: Insufficient documentation

## 2020-04-19 DIAGNOSIS — Z79899 Other long term (current) drug therapy: Secondary | ICD-10-CM | POA: Diagnosis not present

## 2020-04-19 DIAGNOSIS — Z800 Family history of malignant neoplasm of digestive organs: Secondary | ICD-10-CM | POA: Diagnosis not present

## 2020-04-19 DIAGNOSIS — F32A Depression, unspecified: Secondary | ICD-10-CM | POA: Diagnosis not present

## 2020-04-19 DIAGNOSIS — Z7982 Long term (current) use of aspirin: Secondary | ICD-10-CM | POA: Insufficient documentation

## 2020-04-19 DIAGNOSIS — Z961 Presence of intraocular lens: Secondary | ICD-10-CM | POA: Insufficient documentation

## 2020-04-19 DIAGNOSIS — Z808 Family history of malignant neoplasm of other organs or systems: Secondary | ICD-10-CM | POA: Insufficient documentation

## 2020-04-19 DIAGNOSIS — E785 Hyperlipidemia, unspecified: Secondary | ICD-10-CM | POA: Diagnosis not present

## 2020-04-19 DIAGNOSIS — Z9841 Cataract extraction status, right eye: Secondary | ICD-10-CM | POA: Insufficient documentation

## 2020-04-19 DIAGNOSIS — Z9842 Cataract extraction status, left eye: Secondary | ICD-10-CM | POA: Insufficient documentation

## 2020-04-19 DIAGNOSIS — Z853 Personal history of malignant neoplasm of breast: Secondary | ICD-10-CM | POA: Diagnosis not present

## 2020-04-19 DIAGNOSIS — I10 Essential (primary) hypertension: Secondary | ICD-10-CM | POA: Insufficient documentation

## 2020-04-19 DIAGNOSIS — Z87891 Personal history of nicotine dependence: Secondary | ICD-10-CM | POA: Diagnosis not present

## 2020-04-19 HISTORY — PX: COLONOSCOPY WITH PROPOFOL: SHX5780

## 2020-04-19 HISTORY — PX: POLYPECTOMY: SHX5525

## 2020-04-19 SURGERY — COLONOSCOPY WITH PROPOFOL
Anesthesia: General | Site: Rectum

## 2020-04-19 MED ORDER — STERILE WATER FOR IRRIGATION IR SOLN: Status: DC | PRN

## 2020-04-19 MED ORDER — PROPOFOL 10 MG/ML IV BOLUS
INTRAVENOUS | Status: DC | PRN
  Administered 2020-04-19: 40 mg via INTRAVENOUS
  Administered 2020-04-19: 30 mg via INTRAVENOUS
  Administered 2020-04-19: 150 mg via INTRAVENOUS
  Administered 2020-04-19 (×2): 30 mg via INTRAVENOUS
  Administered 2020-04-19 (×2): 40 mg via INTRAVENOUS

## 2020-04-19 MED ORDER — LIDOCAINE HCL (CARDIAC) PF 100 MG/5ML IV SOSY
PREFILLED_SYRINGE | INTRAVENOUS | Status: DC | PRN
  Administered 2020-04-19: 50 mg via INTRAVENOUS

## 2020-04-19 MED ORDER — ACETAMINOPHEN 160 MG/5ML PO SOLN: 325.0000 mg | Freq: Once | ORAL | Status: DC

## 2020-04-19 MED ORDER — ACETAMINOPHEN 325 MG PO TABS: 325.0000 mg | ORAL_TABLET | Freq: Once | ORAL | Status: DC

## 2020-04-19 MED ORDER — SODIUM CHLORIDE 0.9 % IV SOLN: INTRAVENOUS | Status: DC

## 2020-04-19 MED ORDER — LACTATED RINGERS IV SOLN: INTRAVENOUS | Status: DC

## 2020-04-19 SURGICAL SUPPLY — 7 items
GOWN CVR UNV OPN BCK APRN NK (MISCELLANEOUS) ×2 IMPLANT
GOWN ISOL THUMB LOOP REG UNIV (MISCELLANEOUS) ×4
KIT ENDO PROCEDURE OLY (KITS) ×2 IMPLANT
MANIFOLD NEPTUNE II (INSTRUMENTS) ×2 IMPLANT
SNARE SHORT THROW 13M SML OVAL (MISCELLANEOUS) ×2 IMPLANT
TRAP ETRAP POLY (MISCELLANEOUS) ×2 IMPLANT
WATER STERILE IRR 250ML POUR (IV SOLUTION) ×2 IMPLANT

## 2020-04-19 NOTE — Anesthesia Preprocedure Evaluation (Signed)
Anesthesia Evaluation  Patient identified by MRN, date of birth, ID band Patient awake    Reviewed: Allergy & Precautions, H&P , NPO status , Patient's Chart, lab work & pertinent test results  Airway Mallampati: II  TM Distance: >3 FB Neck ROM: full    Dental no notable dental hx.    Pulmonary sleep apnea , former smoker,    Pulmonary exam normal breath sounds clear to auscultation       Cardiovascular hypertension, Normal cardiovascular exam Rhythm:regular Rate:Normal     Neuro/Psych    GI/Hepatic   Endo/Other    Renal/GU      Musculoskeletal   Abdominal   Peds  Hematology   Anesthesia Other Findings   Reproductive/Obstetrics                             Anesthesia Physical Anesthesia Plan  ASA: III  Anesthesia Plan: General   Post-op Pain Management:    Induction: Intravenous  PONV Risk Score and Plan: 3 and Treatment may vary due to age or medical condition, TIVA and Propofol infusion  Airway Management Planned: Natural Airway  Additional Equipment:   Intra-op Plan:   Post-operative Plan:   Informed Consent: I have reviewed the patients History and Physical, chart, labs and discussed the procedure including the risks, benefits and alternatives for the proposed anesthesia with the patient or authorized representative who has indicated his/her understanding and acceptance.     Dental Advisory Given  Plan Discussed with: CRNA  Anesthesia Plan Comments:         Anesthesia Quick Evaluation

## 2020-04-19 NOTE — Op Note (Signed)
[facility] Gastroenterology Patient Name: Tiffany Jarvis Procedure Date: 04/19/2020 9:48 AM MRN: [id number] Account #: [id number] Date of Birth: 1951-05-12 Admit Type: Outpatient Age: 69 Room: [facility] OR ROOM 01 Gender: Female Note Status: Finalized Procedure:             Colonoscopy Indications:           High risk colon cancer surveillance: Personal history                         of colonic polyps Providers:             [name] MD, MD Referring MD:          Deborra Medina, MD (Referring MD) Medicines:             Propofol per Anesthesia Complications:         No immediate complications. Procedure:             Pre-Anesthesia Assessment:                        - Prior to the procedure, a History and Physical was                         performed, and patient medications and allergies were                         reviewed. The patient's tolerance of previous                         anesthesia was also reviewed. The risks and benefits                         of the procedure and the sedation options and risks                         were discussed with the patient. All questions were                         answered, and informed consent was obtained. Prior                         Anticoagulants: The patient has taken no previous                         anticoagulant or antiplatelet agents. ASA Grade                         Assessment: II - A patient with mild systemic disease.                         After reviewing the risks and benefits, the patient                         was deemed in satisfactory condition to undergo the                         procedure.  After obtaining informed consent, the colonoscope was                         passed under direct vision. Throughout the procedure,                         the patient's blood pressure, pulse, and oxygen                         saturations were monitored continuously. The                          Colonoscope was introduced through the anus and                         advanced to the the cecum, identified by appendiceal                         orifice and ileocecal valve. The colonoscopy was                         performed without difficulty. The patient tolerated                         the procedure well. The quality of the bowel                         preparation was excellent. Findings:      The perianal and digital rectal examinations were normal.      A 4 mm polyp was found in the transverse colon. The polyp was sessile.       The polyp was removed with a cold snare. Resection and retrieval were       complete.      Multiple small-mouthed diverticula were found in the sigmoid colon.      Non-bleeding internal hemorrhoids were found during retroflexion. The       hemorrhoids were Grade I (internal hemorrhoids that do not prolapse). Impression:            - One 4 mm polyp in the transverse colon, removed with                         a cold snare. Resected and retrieved.                        - Diverticulosis in the sigmoid colon.                        - Non-bleeding internal hemorrhoids. Recommendation:        - Discharge patient to home.                        - Resume previous diet.                        - Continue present medications.                        - Await pathology results.                        -  Repeat colonoscopy in 7 years for surveillance. Procedure Code(s):     --- Professional ---                        [phone], Colonoscopy, flexible; with removal of                         tumor(s), polyp(s), or other lesion(s) by snare                         technique Diagnosis Code(s):     --- Professional ---                        Z86.010, Personal history of colonic polyps                        K63.5, Polyp of colon CPT copyright 2019 American Medical Association. All rights reserved. The codes documented in this report are preliminary and upon coder  review may  be revised to meet current compliance requirements. [name] MD, MD 04/19/2020 10:19:24 AM This report has been signed electronically. Number of Addenda: 0 Note Initiated On: 04/19/2020 9:48 AM Scope Withdrawal Time: 0 hours 8 minutes 37 seconds  Total Procedure Duration: 0 hours 16 minutes 6 seconds  Estimated Blood Loss:  Estimated blood loss: none.      [facility]

## 2020-04-19 NOTE — Anesthesia Postprocedure Evaluation (Signed)
Anesthesia Post Note  Patient: Tiffany Jarvis  Procedure(s) Performed: COLONOSCOPY WITH BIOPSY (N/A Rectum) POLYPECTOMY (N/A Rectum)     Patient location during evaluation: PACU Anesthesia Type: General Level of consciousness: awake and alert and oriented Pain management: satisfactory to patient Vital Signs Assessment: post-procedure vital signs reviewed and stable Respiratory status: spontaneous breathing, nonlabored ventilation and respiratory function stable Cardiovascular status: blood pressure returned to baseline and stable Postop Assessment: Adequate PO intake and No signs of nausea or vomiting Anesthetic complications: no   No complications documented.  Raliegh Ip

## 2020-04-19 NOTE — Anesthesia Procedure Notes (Signed)
Date/Time: 04/19/2020 9:57 AM Performed by: Cameron Ali, CRNA Pre-anesthesia Checklist: Patient identified, Emergency Drugs available, Suction available, Timeout performed and Patient being monitored Patient Re-evaluated:Patient Re-evaluated prior to induction Oxygen Delivery Method: Nasal cannula Placement Confirmation: positive ETCO2

## 2020-04-19 NOTE — H&P (Signed)
Tiffany Lame, MD [address] [place], [place] [address] [phone] Fax : [phone]  Primary Care Physician:  Crecencio Mc, MD Primary Gastroenterologist:  Dr. Allen Norris  Pre-Procedure History & Physical: HPI:  Tiffany Jarvis is a 69 y.o. female is here for an colonoscopy.   Past Medical History:  Diagnosis Date  . Allergy   . 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 Colon polyps   . Depression   . History of chicken pox   . Hyperlipidemia   . Hypertension   . OSA (obstructive sleep apnea)    not able to tolerate cpap or dental mouth piece   . Vertigo    CT neck and head negative significant CAS (1 episode - "yrs ago")    Past Surgical History:  Procedure Laterality Date  . BREAST BIOPSY Left 2003   positive  . BREAST SURGERY     L mastectomy ~2001/2002 and reconstruction  . CATARACT EXTRACTION W/PHACO Left 01/28/2020   Procedure: CATARACT EXTRACTION PHACO AND INTRAOCULAR LENS PLACEMENT ([place]) LEFT;  Surgeon: Leandrew Koyanagi, MD;  Location: [facility];  Service: Ophthalmology;  Laterality: Left;  5.61 0:53.0 10.6%  . CATARACT EXTRACTION W/PHACO Right 03/03/2020   Procedure: CATARACT EXTRACTION PHACO AND INTRAOCULAR LENS PLACEMENT ([place]) RIGHT;  Surgeon: Leandrew Koyanagi, MD;  Location: [facility];  Service: Ophthalmology;  Laterality: Right;  2.52 0:41.6 6.0%  . CESAREAN SECTION     x 3 1979, 1982, 1992   . COLONOSCOPY    . EYE SURGERY Left 07/2019   retina hole surgery  . MASTECTOMY Left 2003  . OVARIAN CYST REMOVAL Right   . REDUCTION MAMMAPLASTY Right   . TONSILLECTOMY    . TRIGGER FINGER RELEASE      Prior to Admission medications   Medication Sig Start Date End Date Taking? Authorizing Provider  amLODipine (NORVASC) 2.5 MG tablet Take 1 tablet (2.5 mg total) by mouth daily. 07/08/19  Yes Crecencio Mc, MD  aspirin 81 MG tablet Take 81 mg by mouth daily.   Yes [provider]    Calcium Carbonate (CALCIUM 600 PO) Take 1 tablet by mouth daily.   Yes [provider]  lisinopril-hydrochlorothiazide (ZESTORETIC) 20-12.5 MG tablet Take 2 tablets by mouth daily. In am 07/08/19  Yes Crecencio Mc, MD  Misc Natural Products ([place]) Take by mouth.   Yes [provider]  Multiple Vitamin (MULTI-VITAMIN DAILY PO) Multi Vitamin   Yes [provider]  Multiple Vitamins-Minerals (VITAMIN D3 COMPLETE PO) Take 2,000 Units by mouth daily.   Yes [provider]  Omega-3 Fatty Acids (FISH OIL PO) Take by mouth.   Yes [provider]  pravastatin (PRAVACHOL) 40 MG tablet Take 1 tablet (40 mg total) by mouth at bedtime. 06/27/19  Yes Crecencio Mc, MD  Sodium Sulfate-Mag Sulfate-KCl (SUTAB) [phone] MG TABS Take 12 Doses by mouth 2 (two) times daily. 04/18/20  Yes Tiffany Lame, MD  venlafaxine XR (EFFEXOR XR) 150 MG 24 hr capsule Take 1 capsule (150 mg total) by mouth daily with breakfast. 07/08/19  Yes Crecencio Mc, MD  Na Sulfate-K Sulfate-Mg Sulf (SUPREP BOWEL PREP KIT) 17.5-3.13-1.6 GM/177ML SOLN Take 1 kit by mouth as directed. 03/29/20   Tiffany Lame, MD    Allergies as of 03/17/2020  . (No Known Allergies)    Family History  Problem Relation Age of Onset  . Cancer Sister  breast cancer   . Breast cancer Sister   . Cancer Cousin        m 1st cousin breast and pancreatic ca  . Breast cancer Cousin   . Cancer Cousin        breast m 1st cousin  . Breast cancer Cousin   . Cancer Cousin        m 1st cousin breast   . Breast cancer Cousin   . Cancer Cousin        m 1st cousin breast   . Breast cancer Cousin   . Cancer Mother        colon cancer    Social History   Socioeconomic History  . Marital status: Married    Spouse name: Not on file  . Number of children: Not on file  . Years of education: Not on file  . Highest education level: Not on file  Occupational History  . Not on file  Tobacco  Use  . Smoking status: Former Smoker    Packs/day: 0.25    Years: 3.00    Pack years: 0.75    Quit date: 1970    Years since quitting: 51.7  . Smokeless tobacco: Never Used  . Tobacco comment: former smoker x 3 years 19-24 light   Vaping Use  . Vaping Use: Never used  Substance and Sexual Activity  . Alcohol use: Yes    Alcohol/week: 1.0 standard drink    Types: 1 Glasses of wine per week  . Drug use: Not Currently  . Sexual activity: Not Currently    Partners: Male  Other Topics Concern  . Not on file  Social History Narrative   Moved from [place] 2019    3 kids and grandkids    Statistician, retired    Married    No guns, wears seat belt safe in relationship    Social Determinants of Radio broadcast assistant Strain:   . Difficulty of Paying Living Expenses: Not on file  Food Insecurity:   . Worried About Charity fundraiser in the Last Year: Not on file  . Ran Out of Food in the Last Year: Not on file  Transportation Needs:   . Lack of Transportation (Medical): Not on file  . Lack of Transportation (Non-Medical): Not on file  Physical Activity:   . Days of Exercise per Week: Not on file  . Minutes of Exercise per Session: Not on file  Stress:   . Feeling of Stress : Not on file  Social Connections:   . Frequency of Communication with Friends and Family: Not on file  . Frequency of Social Gatherings with Friends and Family: Not on file  . Attends Religious Services: Not on file  . Active Member of Clubs or Organizations: Not on file  . Attends Archivist Meetings: Not on file  . Marital Status: Not on file  Intimate Partner Violence:   . Fear of Current or Ex-Partner: Not on file  . Emotionally Abused: Not on file  . Physically Abused: Not on file  . Sexually Abused: Not on file    Review of Systems: See HPI, otherwise negative ROS  Physical Exam: BP (!) 129/92   Pulse 89   Temp (!) 97.5 F (36.4 C) (Temporal)   Resp 16   Ht '5\' 3"'  (1.6 m)    Wt 84.4 kg   SpO2 97%   BMI 32.95 kg/m  General:   Alert,  pleasant and  cooperative in NAD Head:  Normocephalic and atraumatic. Neck:  Supple; no masses or thyromegaly. Lungs:  Clear throughout to auscultation.    Heart:  Regular rate and rhythm. Abdomen:  Soft, nontender and nondistended. Normal bowel sounds, without guarding, and without rebound.   Neurologic:  Alert and  oriented x4;  grossly normal neurologically.  Impression/Plan: Cipriana Biller is here for an colonoscopy to be performed for history of colon polyps 2016  Risks, benefits, limitations, and alternatives regarding  colonoscopy have been reviewed with the patient.  Questions have been answered.  All parties agreeable.   Tiffany Lame, MD  04/19/2020, 9:49 AM

## 2020-04-19 NOTE — Transfer of Care (Signed)
Immediate Anesthesia Transfer of Care Note  Patient: Tiffany Jarvis  Procedure(s) Performed: COLONOSCOPY WITH BIOPSY (N/A Rectum) POLYPECTOMY (N/A Rectum)  Patient Location: PACU  Anesthesia Type: General  Level of Consciousness: awake, alert  and patient cooperative  Airway and Oxygen Therapy: Patient Spontanous Breathing and Patient connected to supplemental oxygen  Post-op Assessment: Post-op Vital signs reviewed, Patient's Cardiovascular Status Stable, Respiratory Function Stable, Patent Airway and No signs of Nausea or vomiting  Post-op Vital Signs: Reviewed and stable  Complications: No complications documented.

## 2020-04-20 ENCOUNTER — Ambulatory Visit: Payer: Medicare Other | Admitting: Adult Health

## 2020-04-20 ENCOUNTER — Encounter: Payer: Self-pay | Admitting: Gastroenterology

## 2020-04-20 LAB — SURGICAL PATHOLOGY

## 2020-04-21 ENCOUNTER — Encounter: Payer: Self-pay | Admitting: Gastroenterology

## 2020-04-21 DIAGNOSIS — G4733 Obstructive sleep apnea (adult) (pediatric): Secondary | ICD-10-CM

## 2020-04-21 NOTE — Telephone Encounter (Signed)
Tammy, please see pt's mychart message and advise. 

## 2020-04-22 NOTE — Telephone Encounter (Signed)
Should have been in the order for CPAP , was her order not sent in ?   Please order CPAP supplies, Dreamwear nasal mask , enroll in [place]

## 2020-06-03 ENCOUNTER — Ambulatory Visit: Payer: Medicare Other | Admitting: Pulmonary Disease

## 2020-06-14 ENCOUNTER — Other Ambulatory Visit: Payer: Self-pay | Admitting: Internal Medicine

## 2020-06-14 DIAGNOSIS — I10 Essential (primary) hypertension: Secondary | ICD-10-CM

## 2020-06-18 ENCOUNTER — Ambulatory Visit: Payer: Medicare Other | Admitting: Pulmonary Disease

## 2020-06-18 ENCOUNTER — Encounter: Payer: Self-pay | Admitting: Pulmonary Disease

## 2020-06-18 ENCOUNTER — Other Ambulatory Visit: Payer: Self-pay

## 2020-06-18 VITALS — BP 130/72 | HR 78 | Temp 97.3°F | Ht 63.5 in | Wt 192.2 lb

## 2020-06-18 DIAGNOSIS — G4733 Obstructive sleep apnea (adult) (pediatric): Secondary | ICD-10-CM | POA: Diagnosis not present

## 2020-06-18 NOTE — Progress Notes (Addendum)
Tiffany Jarvis Pulmonary, Critical Care, and Sleep Medicine  Chief Complaint  Patient presents with  . Follow-up    OSA, issues with using CPAP    Constitutional:  BP 130/72 (BP Location: Left Arm, Cuff Size: Normal)   Pulse 78   Temp (!) 97.3 F (36.3 C) (Oral)   Ht 5' 3.5" (1.613 m)   Wt 192 lb 3.2 oz (87.2 kg)   SpO2 95%   BMI 33.51 kg/m   Past Medical History:  Allergies, Breast cancer, Colon polyps, Depression, HLD, HTN, Vertigo  Past Surgical History:  Her  has a past surgical history that includes Cesarean section; Breast surgery; Mastectomy (Left, 2003); Breast biopsy (Left, 2003); Reduction mammaplasty (Right); Cataract extraction w/PHACO (Left, 01/28/2020); Trigger finger release; Tonsillectomy; Ovarian cyst removal (Right); Colonoscopy; Eye surgery (Left, 07/2019); Cataract extraction w/PHACO (Right, 03/03/2020); Colonoscopy with propofol (N/A, 04/19/2020); and polypectomy (N/A, 04/19/2020).  Brief Summary:  Tiffany Jarvis is a 69 y.o. female former smoker with obstructive sleep apnea.      Subjective:   She has been trying to use CPAP.  Uses every night for about 6 hours.  Having trouble with mask fit.  Only tried full face mask - was told she needed to because she is a "mouth breather".  Pressure runs too low at times, but otherwise okay.  Physical Exam:   Appearance - well kempt   ENMT - no sinus tenderness, no oral exudate, no LAN, Mallampati 4 airway, no stridor  Respiratory - equal breath sounds bilaterally, no wheezing or rales  CV - s1s2 regular rate and rhythm, no murmurs  Ext - no clubbing, no edema  Skin - no rashes  Psych - normal mood and affect   Sleep Tests:   PSG 02/11/10 >> AHI 35, SpO2 low 75%  HST 03/24/20 >> AHI 23, SpO2 low 76%  Auto CPAP 05/18/20 to 06/16/20 >> used on 29 of 30 nights with average 6 hrs 18 min.  Average AHI 6 with median CPAP 11 and 95 th percentile CPAP 14 cm H2O  Social History:  She  reports that she quit smoking  about 51 years ago. She has a 0.75 pack-year smoking history. She has never used smokeless tobacco. She reports current alcohol use of about 1.0 standard drink of alcohol per week. She reports that she does not use drugs.  Family History:  Her family history includes Breast cancer in her cousin, cousin, cousin, cousin, and sister; Cancer in her cousin, cousin, cousin, cousin, mother, and sister.     Assessment/Plan:   Obstructive sleep apnea. - she is compliant with CPAP  - she uses [employer] for her DME - she is having trouble with mask fit; explained she doesn't have to use a full face mask and mask options are at her discretion - she previously tried an oral appliance, and this didn't work - she would like to learn more about Inspire device; will arrange for referral to ENT - she will be moving to [place] soon and changing insurance in January 2022 - will change her auto CPAP to 8 to 15 cm H2O  Time Spent Involved in Patient Care on Day of Examination:  31 minutes  Follow up:  Patient Instructions  Will arrange for referral to Dr. Redmond Baseman or Dr. Wilburn Cornelia with ENT to assess for Inspire device to treat sleep apnea  Will have [employer] change your auto CPAP to 8 to 15 cm water pressure  Follow up in 6 months   Medication List:  Allergies as of 06/18/2020   No Known Allergies     Medication List       Accurate as of June 18, 2020 11:59 PM. If you have any questions, ask your nurse or doctor.        STOP taking these medications   aspirin 81 MG tablet Stopped by: Vineet Sood, MD   Suprep Bowel Prep Kit 17.5-3.13-1.6 GM/177ML Soln Generic drug: Na Sulfate-K Sulfate-Mg Sulf Stopped by: Vineet Sood, MD   Sutab [phone] MG Tabs Generic drug: Sodium Sulfate-Mag Sulfate-KCl Stopped by: Vineet Sood, MD     TAKE these medications   amLODipine 2.5 MG tablet Commonly known as: NORVASC TAKE 1 TABLET DAILY   CALCIUM 600 PO Take 1 tablet by mouth daily.   FISH OIL PO Take  by mouth.   JOINT HEALTH PO Take by mouth.   lisinopril-hydrochlorothiazide 20-12.5 MG tablet Commonly known as: Zestoretic Take 2 tablets by mouth daily. In am   MULTI-VITAMIN DAILY PO Multi Vitamin   pravastatin 40 MG tablet Commonly known as: PRAVACHOL Take 1 tablet (40 mg total) by mouth at bedtime.   venlafaxine XR 150 MG 24 hr capsule Commonly known as: Effexor XR Take 1 capsule (150 mg total) by mouth daily with breakfast.   VITAMIN D3 COMPLETE PO Take 2,000 Units by mouth daily.       Signature:  Vineet Sood, MD [place] Pulmonary/Critical Care Pager - [phone] 06/21/2020, 11:57 AM           

## 2020-06-18 NOTE — Patient Instructions (Signed)
Will arrange for referral to Dr. Redmond Baseman or Dr. Wilburn Cornelia with ENT to assess for Inspire device to treat sleep apnea  Will have [employer] change your auto CPAP to 8 to 15 cm water pressure  Follow up in 6 months

## 2020-06-21 ENCOUNTER — Other Ambulatory Visit: Payer: Self-pay | Admitting: Internal Medicine

## 2020-06-21 ENCOUNTER — Encounter: Payer: Self-pay | Admitting: Pulmonary Disease

## 2020-06-21 DIAGNOSIS — E785 Hyperlipidemia, unspecified: Secondary | ICD-10-CM

## 2020-06-21 NOTE — Telephone Encounter (Signed)
Dr. Halford Chessman could you please advise on this My Chart message:  Hello Dr. Halford Chessman,  I was reading over the social history in the Progress Notes from my December 3rd visit.  I don't know why but it states "She reports previous drug use".  Could you take that note out as I never reported that!  Thank you.  Tiffany Jarvis Thank you

## 2020-06-22 ENCOUNTER — Ambulatory Visit: Payer: Medicare Other | Admitting: Primary Care

## 2020-06-28 ENCOUNTER — Other Ambulatory Visit: Payer: Self-pay | Admitting: Internal Medicine

## 2020-06-28 DIAGNOSIS — R232 Flushing: Secondary | ICD-10-CM

## 2020-06-28 DIAGNOSIS — I10 Essential (primary) hypertension: Secondary | ICD-10-CM

## 2020-07-01 ENCOUNTER — Ambulatory Visit: Payer: Medicare Other | Admitting: Adult Health

## 2020-08-24 ENCOUNTER — Other Ambulatory Visit: Payer: Self-pay | Admitting: Otolaryngology

## 2020-08-27 ENCOUNTER — Telehealth: Payer: Self-pay

## 2020-08-27 ENCOUNTER — Ambulatory Visit: Payer: Medicare Other | Admitting: Internal Medicine

## 2020-08-27 DIAGNOSIS — Z0289 Encounter for other administrative examinations: Secondary | ICD-10-CM

## 2020-08-27 NOTE — Telephone Encounter (Signed)
Pt was late for appt and needed to resched. She has to have health exam certificate filled out before the end of the month for work. There are only virtual and same day/HFU appts the rest of this month. Pt wants Dr Derrel Nip to know this. Placed copy of health cert up front in folder so Dr Derrel Nip can see what she needs.

## 2020-08-30 NOTE — Telephone Encounter (Signed)
Yes you can schedule for 12:30 tomorrow or the following Tuesday

## 2020-08-30 NOTE — Telephone Encounter (Signed)
Have a note on my desk attached to form stating to offer pt an appt on Tuesday at 12:30. Is that for this Tuesday?

## 2020-08-30 NOTE — Telephone Encounter (Signed)
Spoke with pt and she stated that she needed a later appt time so pt has been scheduled for 09/13/2020 at 2:30.

## 2020-09-10 ENCOUNTER — Ambulatory Visit: Payer: Medicare Other

## 2020-09-13 ENCOUNTER — Ambulatory Visit: Payer: Medicare Other | Admitting: Internal Medicine

## 2020-09-13 ENCOUNTER — Encounter: Payer: Self-pay | Admitting: Internal Medicine

## 2020-09-13 ENCOUNTER — Other Ambulatory Visit: Payer: Self-pay

## 2020-09-13 VITALS — BP 122/84 | HR 85 | Temp 98.5°F | Ht 63.5 in | Wt 193.1 lb

## 2020-09-13 DIAGNOSIS — Z86000 Personal history of in-situ neoplasm of breast: Secondary | ICD-10-CM | POA: Diagnosis not present

## 2020-09-13 DIAGNOSIS — Z23 Encounter for immunization: Secondary | ICD-10-CM | POA: Diagnosis not present

## 2020-09-13 DIAGNOSIS — Z0000 Encounter for general adult medical examination without abnormal findings: Secondary | ICD-10-CM

## 2020-09-13 DIAGNOSIS — C50919 Malignant neoplasm of unspecified site of unspecified female breast: Secondary | ICD-10-CM | POA: Diagnosis not present

## 2020-09-13 DIAGNOSIS — E7800 Pure hypercholesterolemia, unspecified: Secondary | ICD-10-CM

## 2020-09-13 DIAGNOSIS — I10 Essential (primary) hypertension: Secondary | ICD-10-CM

## 2020-09-13 DIAGNOSIS — Z86010 Personal history of colonic polyps: Secondary | ICD-10-CM | POA: Diagnosis not present

## 2020-09-13 DIAGNOSIS — R7303 Prediabetes: Secondary | ICD-10-CM

## 2020-09-13 DIAGNOSIS — E669 Obesity, unspecified: Secondary | ICD-10-CM

## 2020-09-13 MED ORDER — TETANUS-DIPHTH-ACELL PERTUSSIS 5-2.5-18.5 LF-MCG/0.5 IM SUSY: 0.5000 mL | PREFILLED_SYRINGE | Freq: Once | INTRAMUSCULAR | 0 refills | Status: AC

## 2020-09-13 NOTE — Assessment & Plan Note (Signed)

## 2020-09-13 NOTE — Assessment & Plan Note (Signed)
Managed with low glycemic index diet and exercise in the past.  encouraged to resume exercise

## 2020-09-13 NOTE — Patient Instructions (Signed)
GO GET YOUR TDAP TODAY SO YOU DON'T MAKE A LIAR OUT OF ME!  BONE DENSITY TO BE REPEATED EVERY 5 YEARS  (2025)     Health Maintenance After Age 68 After age 69, you are at a higher risk for certain long-term diseases and infections as well as injuries from falls. Falls are a major cause of broken bones and head injuries in people who are older than age 50. Getting regular preventive care can help to keep you healthy and well. Preventive care includes getting regular testing and making lifestyle changes as recommended by your health care provider. Talk with your health care provider about:  Which screenings and tests you should have. A screening is a test that checks for a disease when you have no symptoms.  A diet and exercise plan that is right for you. What should I know about screenings and tests to prevent falls? Screening and testing are the best ways to find a health problem early. Early diagnosis and treatment give you the best chance of managing medical conditions that are common after age 22. Certain conditions and lifestyle choices may make you more likely to have a fall. Your health care provider may recommend:  Regular vision checks. Poor vision and conditions such as cataracts can make you more likely to have a fall. If you wear glasses, make sure to get your prescription updated if your vision changes.  Medicine review. Work with your health care provider to regularly review all of the medicines you are taking, including over-the-counter medicines. Ask your health care provider about any side effects that may make you more likely to have a fall. Tell your health care provider if any medicines that you take make you feel dizzy or sleepy.  Osteoporosis screening. Osteoporosis is a condition that causes the bones to get weaker. This can make the bones weak and cause them to break more easily.  Blood pressure screening. Blood pressure changes and medicines to control blood pressure can  make you feel dizzy.  Strength and balance checks. Your health care provider may recommend certain tests to check your strength and balance while standing, walking, or changing positions.  Foot health exam. Foot pain and numbness, as well as not wearing proper footwear, can make you more likely to have a fall.  Depression screening. You may be more likely to have a fall if you have a fear of falling, feel emotionally low, or feel unable to do activities that you used to do.  Alcohol use screening. Using too much alcohol can affect your balance and may make you more likely to have a fall. What actions can I take to lower my risk of falls? General instructions  Talk with your health care provider about your risks for falling. Tell your health care provider if: ? You fall. Be sure to tell your health care provider about all falls, even ones that seem minor. ? You feel dizzy, sleepy, or off-balance.  Take over-the-counter and prescription medicines only as told by your health care provider. These include any supplements.  Eat a healthy diet and maintain a healthy weight. A healthy diet includes low-fat dairy products, low-fat (lean) meats, and fiber from whole grains, beans, and lots of fruits and vegetables. Home safety  Remove any tripping hazards, such as rugs, cords, and clutter.  Install safety equipment such as grab bars in bathrooms and safety rails on stairs.  Keep rooms and walkways well-lit. Activity  Follow a regular exercise program to stay  fit. This will help you maintain your balance. Ask your health care provider what types of exercise are appropriate for you.  If you need a cane or walker, use it as recommended by your health care provider.  Wear supportive shoes that have nonskid soles.   Lifestyle  Do not drink alcohol if your health care provider tells you not to drink.  If you drink alcohol, limit how much you have: ? 0-1 drink a day for women. ? 0-2 drinks a  day for men.  Be aware of how much alcohol is in your drink. In the [place] one drink equals one typical bottle of beer (12 oz), one-half glass of wine (5 oz), or one shot of hard liquor (1 oz).  Do not use any products that contain nicotine or tobacco, such as cigarettes and e-cigarettes. If you need help quitting, ask your health care provider. Summary  Having a healthy lifestyle and getting preventive care can help to protect your health and wellness after age 2.  Screening and testing are the best way to find a health problem early and help you avoid having a fall. Early diagnosis and treatment give you the best chance for managing medical conditions that are more common for people who are older than age 83.  Falls are a major cause of broken bones and head injuries in people who are older than age 49. Take precautions to prevent a fall at home.  Work with your health care provider to learn what changes you can make to improve your health and wellness and to prevent falls. This information is not intended to replace advice given to you by your health care provider. Make sure you discuss any questions you have with your health care provider. Document Revised: 10/24/2018 Document Reviewed: 05/16/2017 Elsevier Patient Education  2021 [employer].

## 2020-09-13 NOTE — Assessment & Plan Note (Signed)
Annual mammograms are done in September  By Dr Noreene Filbert

## 2020-09-13 NOTE — Assessment & Plan Note (Signed)
Well controlled on current regimen of lisinopril/hct and amlodipine . Renal function stable, no changes today.

## 2020-09-13 NOTE — Assessment & Plan Note (Signed)
I have congratulated her in recent history of 15 lb weight loss encouraged  Her to renew efforts at weight loss with goal of 10% of body weight over the next 6 months using a low glycemic index diet and regular exercise a minimum of 5 days per week.

## 2020-09-13 NOTE — Assessment & Plan Note (Addendum)
Managed with pravastatin. 

## 2020-09-13 NOTE — Assessment & Plan Note (Signed)
Annual screening mammograms of right breast are done every September by Dr Glenn Chrystal  ?

## 2020-09-13 NOTE — Progress Notes (Signed)
Patient ID: Tiffany Jarvis, female    DOB: [birth year]  Age: 70 y.o. MRN: [id number]  The patient is here for annual PREVENTIVE  examination and management of other chronic and acute problems.   The risk factors are reflected in the social history.  The roster of all physicians providing medical care to patient - is listed in the Snapshot section of the chart.  Activities of daily living:  The patient is 100% independent in all ADLs: dressing, toileting, feeding as well as independent mobility  Home safety : The patient has smoke detectors in the home. They wear seatbelts.  There are no firearms at home. There is no violence in the home.   There is no risks for hepatitis, STDs or HIV. There is no   history of blood transfusion. They have no travel history to infectious disease endemic areas of the world.  The patient has seen their dentist in the last six month. They have seen their eye doctor in the last year. She denies any  hearing difficulty with regard to whispered voices and some television programs.  They have deferred audiologic testing in the last year.  They do not  have excessive sun exposure. Discussed the need for sun protection: hats, long sleeves and use of sunscreen if there is significant sun exposure.   Diet: the importance of a healthy diet is discussed. They do have a healthy diet.  The benefits of regular aerobic exercise were discussed. She was exercising regularly 7 days per week until she started working again full time several weeks ago   Depression screen: there are no signs or vegative symptoms of depression- irritability, change in appetite, anhedonia, sadness/tearfullness.  Cognitive assessment: the patient manages all their financial and personal affairs and is actively engaged. They could relate day,date,year and events; recalled 2/3 objects at 3 minutes; performed clock-face test normally.  The following portions of the patient's history were reviewed and updated as  appropriate: allergies, current medications, past family history, past medical history,  past surgical history, past social history  and problem list.  Visual acuity was not assessed per patient preference since she has regular follow up with Tiffany Jarvis ophthalmologist. Hearing and body mass index were assessed and reviewed.   During the course of the visit the patient was educated and counseled about appropriate screening and preventive services including : fall prevention , diabetes screening, nutrition counseling, colorectal cancer screening, and recommended immunizations.    CC: The primary encounter diagnosis was Prediabetes. Diagnoses of Pure hypercholesterolemia, Encounter for preventive health examination, Obesity (BMI 30-39.9), Primary hypertension, Malignant neoplasm of female breast, unspecified estrogen receptor status, unspecified laterality, unspecified site of breast ([place]), History of ductal carcinoma in situ (DCIS) of breast, Need for pneumococcal vaccine, and Personal history of colonic polyps were also pertinent to this visit.  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  Will be going into the schools in a few weeks.   Had the TB skin test done    Lost 15 lbs through diet and exercise but regained it once she started working    History Tiffany Jarvis has a past medical history of Allergy, Cancer ([place]), Colon polyps, Depression, History of chicken pox, Hyperlipidemia, Hypertension, Influenza due to influenza virus, type B (08/25/2018), OSA (obstructive sleep apnea), and Vertigo.   She has a past surgical history that includes Cesarean section; Breast surgery; Mastectomy (Left, 2003); Breast biopsy (Left, 2003); Reduction mammaplasty (Right); Cataract extraction w/PHACO (Left, 01/28/2020); Trigger finger release;  Tonsillectomy; Ovarian cyst removal (Right); Colonoscopy; Eye surgery (Left, 07/2019); Cataract extraction w/PHACO (Right, 03/03/2020); Colonoscopy with propofol  (N/A, 04/19/2020); and polypectomy (N/A, 04/19/2020).   Tiffany Jarvis family history includes Breast cancer in Tiffany Jarvis cousin, cousin, cousin, cousin, and sister; Cancer in Tiffany Jarvis cousin, cousin, cousin, cousin, mother, and sister.She reports that she quit smoking about 52 years ago. She has a 0.75 pack-year smoking history. She has never used smokeless tobacco. She reports current alcohol use of about 1.0 standard drink of alcohol per week. She reports that she does not use drugs.  Outpatient Medications Prior to Visit  Medication Sig Dispense Refill  . amLODipine (NORVASC) 2.5 MG tablet TAKE 1 TABLET DAILY 90 tablet 3  . Calcium Carbonate (CALCIUM 600 PO) Take 1 tablet by mouth daily.    Marland Kitchen lisinopril-hydrochlorothiazide (ZESTORETIC) 20-12.5 MG tablet TAKE 2 TABLETS DAILY IN THE MORNING 180 tablet 3  . Misc Natural Products (JOINT HEALTH PO) Take by mouth.    . Multiple Vitamin (MULTI-VITAMIN DAILY PO) Multi Vitamin    . Multiple Vitamins-Minerals (VITAMIN D3 COMPLETE PO) Take 2,000 Units by mouth daily.    . Omega-3 Fatty Acids (FISH OIL PO) Take by mouth.    . pravastatin (PRAVACHOL) 40 MG tablet TAKE 1 TABLET AT BEDTIME 90 tablet 3  . venlafaxine XR (EFFEXOR-XR) 150 MG 24 hr capsule TAKE 1 CAPSULE DAILY WITH BREAKFAST 90 capsule 3   No facility-administered medications prior to visit.    Review of Systems   Patient denies headache, fevers, malaise, unintentional weight loss, skin rash, eye pain, sinus congestion and sinus pain, sore throat, dysphagia,  hemoptysis , cough, dyspnea, wheezing, chest pain, palpitations, orthopnea, edema, abdominal pain, nausea, melena, diarrhea, constipation, flank pain, dysuria, hematuria, urinary  Frequency, nocturia, numbness, tingling, seizures,  Focal weakness, Loss of consciousness,  Tremor, insomnia, depression, anxiety, and suicidal ideation.      Objective:  BP 122/84 (BP Location: Left Arm, Patient Position: Sitting, Cuff Size: Large)   Pulse 85   Temp 98.5 F  (36.9 C) (Oral)   Ht 5' 3.5" (1.613 m)   Wt 193 lb 1.9 oz (87.6 kg)   SpO2 95%   BMI 33.67 kg/m   Physical Exam  General appearance: alert, cooperative and appears stated age Ears: normal TM's and external ear canals both ears Throat: lips, mucosa, and tongue normal; teeth and gums normal Neck: no adenopathy, no carotid bruit, supple, symmetrical, trachea midline and thyroid not enlarged, symmetric, no tenderness/mass/nodules Back: symmetric, no curvature. ROM normal. No CVA tenderness. Lungs: clear to auscultation bilaterally Heart: regular rate and rhythm, S1, S2 normal, no murmur, click, rub or gallop Abdomen: soft, non-tender; bowel sounds normal; no masses,  no organomegaly Pulses: 2+ and symmetric Skin: Skin color, texture, turgor normal. No rashes or lesions Lymph nodes: Cervical, supraclavicular, and axillary nodes normal.  Assessment & Plan:   Problem List Items Addressed This Visit      Unprioritized   Pure hypercholesterolemia    Managed with pravastatin.      Relevant Orders   Lipid panel   TSH   Prediabetes - Primary    Managed with low glycemic index diet and exercise in the past.  encouraged to resume exercise      Relevant Orders   Comprehensive metabolic panel   Hemoglobin A1c   Personal history of colonic polyps    A 4 mm sessiel serrated polyp was removed during Oct 2021 colonoscpy by [name].  7 yr follow up is advised.  Obesity (BMI 30-39.9)    I have congratulated Tiffany Jarvis in recent history of 15 lb weight loss encouraged  Tiffany Jarvis to renew efforts at weight loss with goal of 10% of body weight over the next 6 months using a low glycemic index diet and regular exercise a minimum of 5 days per week.        RESOLVED: Malignant neoplasm of breast (female) [facility])    Annual mammograms are done in September  By Dr Noreene Filbert      HTN (hypertension)    Well controlled on current regimen of lisinopril/hct and amlodipine . Renal function stable,  no changes today.      History of ductal carcinoma in situ (DCIS) of breast    Annual screening mammograms of right breast are done every September by Dr Noreene Filbert       Encounter for preventive health examination    age appropriate education and counseling updated, referrals for preventative services and immunizations addressed, dietary and smoking counseling addressed, most recent labs reviewed.  I have personally reviewed and have noted:  1) the patient's medical and social history 2) The pt's use of alcohol, tobacco, and illicit drugs 3) The patient's current medications and supplements 4) Functional ability including ADL's, fall risk, home safety risk, hearing and visual impairment 5) Diet and physical activities 6) Evidence for depression or mood disorder 7) The patient's height, weight, and BMI have been recorded in the chart  I have made referrals, and provided counseling and education based on review of the above       Other Visit Diagnoses    Need for pneumococcal vaccine       Relevant Orders   Pneumococcal polysaccharide vaccine 23-valent greater than or equal to 2yo subcutaneous/IM (Completed)      I am having [name] start on Tdap. I am also having Tiffany Jarvis maintain Tiffany Jarvis Multiple Vitamin (MULTI-VITAMIN DAILY PO), Omega-3 Fatty Acids (FISH OIL PO), Multiple Vitamins-Minerals (VITAMIN D3 COMPLETE PO), Calcium Carbonate (CALCIUM 600 PO), Misc Natural Products (JOINT HEALTH PO), amLODipine, pravastatin, lisinopril-hydrochlorothiazide, and venlafaxine XR.  Meds ordered this encounter  Medications  . Tdap (BOOSTRIX) 5-2.5-18.5 LF-MCG/0.5 injection    Sig: Inject 0.5 mLs into the muscle once for 1 dose.    Dispense:  0.5 mL    Refill:  0    There are no discontinued medications.  Follow-up: No follow-ups on file.   Crecencio Mc, MD

## 2020-09-14 ENCOUNTER — Encounter: Payer: Self-pay | Admitting: Internal Medicine

## 2020-09-14 LAB — COMPREHENSIVE METABOLIC PANEL
ALT: 23 U/L (ref 0–35)
AST: 20 U/L (ref 0–37)
Albumin: 4.4 g/dL (ref 3.5–5.2)
Alkaline Phosphatase: 54 U/L (ref 39–117)
BUN: 18 mg/dL (ref 6–23)
CO2: 31 mEq/L (ref 19–32)
Calcium: 9.9 mg/dL (ref 8.4–10.5)
Chloride: 98 mEq/L (ref 96–112)
Creatinine, Ser: 0.76 mg/dL (ref 0.40–1.20)
GFR: 81.19 mL/min (ref >60.00)
Glucose, Bld: 97 mg/dL (ref 70–99)
Potassium: 3.9 mEq/L (ref 3.5–5.1)
Sodium: 137 mEq/L (ref 135–145)
Total Bilirubin: 0.6 mg/dL (ref 0.2–1.2)
Total Protein: 7.1 g/dL (ref 6.0–8.3)

## 2020-09-14 LAB — LIPID PANEL
Cholesterol: 231 mg/dL — ABNORMAL HIGH (ref 0–200)
HDL: 73.7 mg/dL (ref >39.00)
LDL Cholesterol: 122 mg/dL — ABNORMAL HIGH (ref 0–99)
NonHDL: 157.74
Total CHOL/HDL Ratio: 3
Triglycerides: 178 mg/dL — ABNORMAL HIGH (ref 0.0–149.0)
VLDL: 35.6 mg/dL (ref 0.0–40.0)

## 2020-09-14 LAB — TSH: TSH: 1.51 u[IU]/mL (ref 0.35–4.50)

## 2020-09-14 LAB — HEMOGLOBIN A1C: Hgb A1c MFr Bld: 6.1 % (ref 4.6–6.5)

## 2020-09-14 NOTE — Assessment & Plan Note (Signed)
A 4 mm sessiel serrated polyp was removed during Oct 2021 colonoscpy by [name].  7 yr follow up is advised.

## 2020-09-16 ENCOUNTER — Encounter (HOSPITAL_BASED_OUTPATIENT_CLINIC_OR_DEPARTMENT_OTHER): Payer: Self-pay | Admitting: Otolaryngology

## 2020-09-16 ENCOUNTER — Other Ambulatory Visit: Payer: Self-pay

## 2020-09-18 ENCOUNTER — Inpatient Hospital Stay (HOSPITAL_COMMUNITY): Admission: RE | Admit: 2020-09-18 | Payer: Medicare Other | Source: Ambulatory Visit

## 2020-09-20 ENCOUNTER — Other Ambulatory Visit (HOSPITAL_COMMUNITY)
Admission: RE | Admit: 2020-09-20 | Discharge: 2020-09-20 | Disposition: A | Discharge status: Home / Self Care | Payer: Medicare Other | Source: Ambulatory Visit | Attending: Otolaryngology | Admitting: Otolaryngology

## 2020-09-20 DIAGNOSIS — Z01812 Encounter for preprocedural laboratory examination: Secondary | ICD-10-CM | POA: Insufficient documentation

## 2020-09-20 DIAGNOSIS — Z20822 Contact with and (suspected) exposure to covid-19: Secondary | ICD-10-CM | POA: Insufficient documentation

## 2020-09-20 LAB — SARS CORONAVIRUS 2 (TAT 6-24 HRS): SARS Coronavirus 2: NEGATIVE

## 2020-09-21 ENCOUNTER — Encounter (HOSPITAL_BASED_OUTPATIENT_CLINIC_OR_DEPARTMENT_OTHER)
Admission: RE | Admit: 2020-09-21 | Discharge: 2020-09-21 | Disposition: A | Discharge status: Home / Self Care | Payer: Medicare Other | Source: Ambulatory Visit | Attending: Otolaryngology | Admitting: Otolaryngology

## 2020-09-21 DIAGNOSIS — Z01812 Encounter for preprocedural laboratory examination: Secondary | ICD-10-CM | POA: Insufficient documentation

## 2020-09-22 ENCOUNTER — Ambulatory Visit (HOSPITAL_BASED_OUTPATIENT_CLINIC_OR_DEPARTMENT_OTHER): Payer: Medicare Other | Admitting: Certified Registered"

## 2020-09-22 ENCOUNTER — Other Ambulatory Visit: Payer: Self-pay

## 2020-09-22 ENCOUNTER — Ambulatory Visit (HOSPITAL_BASED_OUTPATIENT_CLINIC_OR_DEPARTMENT_OTHER)
Admission: RE | Admit: 2020-09-22 | Discharge: 2020-09-22 | Disposition: A | Discharge status: Home / Self Care | Payer: Medicare Other | Attending: Otolaryngology | Admitting: Otolaryngology

## 2020-09-22 ENCOUNTER — Encounter (HOSPITAL_BASED_OUTPATIENT_CLINIC_OR_DEPARTMENT_OTHER)
Admission: RE | Disposition: A | Discharge status: Home / Self Care | Payer: Self-pay | Source: Home / Self Care | Attending: Otolaryngology

## 2020-09-22 ENCOUNTER — Encounter (HOSPITAL_BASED_OUTPATIENT_CLINIC_OR_DEPARTMENT_OTHER): Payer: Self-pay | Admitting: Otolaryngology

## 2020-09-22 DIAGNOSIS — Z87891 Personal history of nicotine dependence: Secondary | ICD-10-CM | POA: Diagnosis not present

## 2020-09-22 DIAGNOSIS — Z853 Personal history of malignant neoplasm of breast: Secondary | ICD-10-CM | POA: Diagnosis not present

## 2020-09-22 DIAGNOSIS — Z79899 Other long term (current) drug therapy: Secondary | ICD-10-CM | POA: Insufficient documentation

## 2020-09-22 DIAGNOSIS — Z800 Family history of malignant neoplasm of digestive organs: Secondary | ICD-10-CM | POA: Insufficient documentation

## 2020-09-22 DIAGNOSIS — Z803 Family history of malignant neoplasm of breast: Secondary | ICD-10-CM | POA: Diagnosis not present

## 2020-09-22 DIAGNOSIS — G4733 Obstructive sleep apnea (adult) (pediatric): Secondary | ICD-10-CM | POA: Diagnosis not present

## 2020-09-22 HISTORY — PX: DRUG INDUCED ENDOSCOPY: SHX6808

## 2020-09-22 SURGERY — DRUG INDUCED SLEEP ENDOSCOPY
Anesthesia: General | Site: Nose

## 2020-09-22 MED ORDER — LACTATED RINGERS IV SOLN: INTRAVENOUS | Status: DC

## 2020-09-22 MED ORDER — OXYCODONE HCL 5 MG/5ML PO SOLN: 5.0000 mg | Freq: Once | ORAL | Status: DC | PRN

## 2020-09-22 MED ORDER — AMISULPRIDE (ANTIEMETIC) 5 MG/2ML IV SOLN: 10.0000 mg | Freq: Once | INTRAVENOUS | Status: DC | PRN

## 2020-09-22 MED ORDER — ONDANSETRON HCL 4 MG/2ML IJ SOLN: 4.0000 mg | Freq: Once | INTRAMUSCULAR | Status: DC | PRN

## 2020-09-22 MED ORDER — OXYCODONE HCL 5 MG PO TABS: 5.0000 mg | ORAL_TABLET | Freq: Once | ORAL | Status: DC | PRN

## 2020-09-22 MED ORDER — FENTANYL CITRATE (PF) 100 MCG/2ML IJ SOLN: 25.0000 ug | INTRAMUSCULAR | Status: DC | PRN

## 2020-09-22 MED ORDER — ONDANSETRON HCL 4 MG/2ML IJ SOLN
INTRAMUSCULAR | Status: DC | PRN
  Administered 2020-09-22: 4 mg via INTRAVENOUS

## 2020-09-22 MED ORDER — PROPOFOL 500 MG/50ML IV EMUL
INTRAVENOUS | Status: AC
  Filled 2020-09-22: qty 50

## 2020-09-22 MED ORDER — PROPOFOL 500 MG/50ML IV EMUL
INTRAVENOUS | Status: DC | PRN
  Administered 2020-09-22 (×4): 20 mg via INTRAVENOUS
  Administered 2020-09-22: 75 ug/kg/min via INTRAVENOUS
  Administered 2020-09-22 (×3): 20 mg via INTRAVENOUS

## 2020-09-22 MED ORDER — LIDOCAINE HCL (CARDIAC) PF 100 MG/5ML IV SOSY
PREFILLED_SYRINGE | INTRAVENOUS | Status: DC | PRN
  Administered 2020-09-22: 40 mg via INTRAVENOUS

## 2020-09-22 MED ORDER — OXYMETAZOLINE HCL 0.05 % NA SOLN
NASAL | Status: DC | PRN
  Administered 2020-09-22: 1 via TOPICAL

## 2020-09-22 SURGICAL SUPPLY — 13 items
CANISTER SUCT 1200ML W/VALVE (MISCELLANEOUS) IMPLANT
COVER WAND RF STERILE (DRAPES) IMPLANT
GLOVE SURG ENC MOIS LTX SZ7.5 (GLOVE) ×2 IMPLANT
KIT CLEAN ENDO (MISCELLANEOUS) ×2 IMPLANT
NEEDLE PRECISIONGLIDE 27X1.5 (NEEDLE) IMPLANT
PACK BASIN DAY SURGERY FS (CUSTOM PROCEDURE TRAY) IMPLANT
PATTIES SURGICAL .5 X3 (DISPOSABLE) ×2 IMPLANT
SHEET MEDIUM DRAPE 40X70 STRL (DRAPES) IMPLANT
SOL ANTI FOG 6CC (MISCELLANEOUS) ×1 IMPLANT
SOLUTION ANTI FOG 6CC (MISCELLANEOUS) ×1
SYR CONTROL 10ML LL (SYRINGE) IMPLANT
TOWEL GREEN STERILE FF (TOWEL DISPOSABLE) ×2 IMPLANT
TUBE CONNECTING 20X1/4 (TUBING) IMPLANT

## 2020-09-22 NOTE — Transfer of Care (Signed)
Immediate Anesthesia Transfer of Care Note  Patient: Tiffany Jarvis  Procedure(s) Performed: DRUG INDUCED ENDOSCOPY (N/A Nose)  Patient Location: PACU  Anesthesia Type:MAC  Level of Consciousness: awake  Airway & Oxygen Therapy: Patient Spontanous Breathing and Patient connected to face mask oxygen  Post-op Assessment: Report given to RN and Post -op Vital signs reviewed and stable  Post vital signs: Reviewed and stable  Last Vitals:  Vitals Value Taken Time  BP 144/86 09/22/20 1248  Temp 36.7 C 09/22/20 1249  Pulse 73 09/22/20 1249  Resp 23 09/22/20 1249  SpO2 100 % 09/22/20 1249  Vitals shown include unvalidated device data.  Last Pain:  Vitals:   09/22/20 1022  TempSrc: Oral  PainSc: 0-No pain         Complications: No complications documented.

## 2020-09-22 NOTE — Discharge Instructions (Signed)

## 2020-09-22 NOTE — Anesthesia Preprocedure Evaluation (Addendum)
Anesthesia Evaluation  Patient identified by MRN, date of birth, ID band Patient awake    Reviewed: Allergy & Precautions, H&P , NPO status , Patient's Chart, lab work & pertinent test results  Airway Mallampati: II  TM Distance: >3 FB Neck ROM: full    Dental no notable dental hx.    Pulmonary sleep apnea , former smoker,    Pulmonary exam normal breath sounds clear to auscultation       Cardiovascular Exercise Tolerance: Good hypertension, + Past MI   Rhythm:regular Rate:Normal     Neuro/Psych  Headaches, PSYCHIATRIC DISORDERS Anxiety Depression    GI/Hepatic negative GI ROS, Neg liver ROS,   Endo/Other  negative endocrine ROS  Renal/GU negative Renal ROS     Musculoskeletal negative musculoskeletal ROS (+)   Abdominal   Peds  Hematology negative hematology ROS (+)   Anesthesia Other Findings H/o breast cancer  Reproductive/Obstetrics negative OB ROS                            Anesthesia Physical  Anesthesia Plan  ASA: III  Anesthesia Plan: General   Post-op Pain Management:    Induction: Intravenous  PONV Risk Score and Plan: 3 and Treatment may vary due to age or medical condition and Propofol infusion  Airway Management Planned: Natural Airway  Additional Equipment: None  Intra-op Plan:   Post-operative Plan:   Informed Consent: I have reviewed the patients History and Physical, chart, labs and discussed the procedure including the risks, benefits and alternatives for the proposed anesthesia with the patient or authorized representative who has indicated his/her understanding and acceptance.     Dental Advisory Given  Plan Discussed with: CRNA and Anesthesiologist  Anesthesia Plan Comments:       Anesthesia Quick Evaluation

## 2020-09-22 NOTE — Op Note (Signed)
Preop diagnosis: Obstructive sleep apnea Postop diagnosis: same Procedure: Drug-induced sleep endoscopy Surgeon: Redmond Baseman Anesth: IV sedation Compl: None Findings: There is 100% anterior-posterior collapse at the velum making her a good candidate for hypoglossal nerve stimulator placement.  There was mild collapse at the tongue base. Description:  After discussing risks, benefits, and alternatives, the patient was brought to the operative suite and placed on the operative table in the supine position.  Anesthesia was induced and the patient was given light sedation to simulate natural sleep. When the proper level was reached, an Afrin-soaked pledget was placed in the right nasal passage for a couple of minutes and then removed.  The fiberoptic laryngoscope was then passed to view the pharynx and larynx.  Findings are noted above and the exam was recorded.  After completion, the scope was removed and the patient was returned to anesthesia for wakeup and was moved to the recovery room in stable condition.

## 2020-09-22 NOTE — Anesthesia Postprocedure Evaluation (Signed)
Anesthesia Post Note  Patient: Sway Guttierrez  Procedure(s) Performed: DRUG INDUCED ENDOSCOPY (N/A Nose)     Patient location during evaluation: PACU Anesthesia Type: General Level of consciousness: awake Pain management: pain level controlled Vital Signs Assessment: post-procedure vital signs reviewed and stable Respiratory status: spontaneous breathing and respiratory function stable Cardiovascular status: stable Postop Assessment: no apparent nausea or vomiting Anesthetic complications: no   No complications documented.  Last Vitals:  Vitals:   09/22/20 1249 09/22/20 1300  BP: (!) 144/86   Pulse: 74 71  Resp: 16 15  Temp: 36.7 C   SpO2: 100% 97%    Last Pain:  Vitals:   09/22/20 1249  TempSrc:   PainSc: 0-No pain                 Merlinda Frederick

## 2020-09-22 NOTE — H&P (Signed)
Tiffany Jarvis is an 70 y.o. female.   Chief Complaint: sleep apnea HPI: 70 year old female with obstructive sleep apnea who has had difficulty tolerating CPAP.  She presents for sleep endoscopy.  Past Medical History:  Diagnosis Date  . Allergy   . 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 Colon polyps   . Depression   . History of chicken pox   . Hyperlipidemia   . Hypertension   . Influenza due to influenza virus, type B 08/25/2018  . OSA (obstructive sleep apnea)    not able to tolerate cpap or dental mouth piece   . Vertigo    CT neck and head negative significant CAS (1 episode - "yrs ago")    Past Surgical History:  Procedure Laterality Date  . BREAST BIOPSY Left 2003   positive  . BREAST SURGERY     L mastectomy ~2001/2002 and reconstruction  . CATARACT EXTRACTION W/PHACO Left 01/28/2020   Procedure: CATARACT EXTRACTION PHACO AND INTRAOCULAR LENS PLACEMENT ([place]) LEFT;  Surgeon: Leandrew Koyanagi, MD;  Location: [place];  Service: Ophthalmology;  Laterality: Left;  5.61 0:53.0 10.6%  . CATARACT EXTRACTION W/PHACO Right 03/03/2020   Procedure: CATARACT EXTRACTION PHACO AND INTRAOCULAR LENS PLACEMENT ([place]) RIGHT;  Surgeon: Leandrew Koyanagi, MD;  Location: [place];  Service: Ophthalmology;  Laterality: Right;  2.52 0:41.6 6.0%  . CESAREAN SECTION     x 3 1979, 1982, 1992   . COLONOSCOPY    . COLONOSCOPY WITH PROPOFOL N/A 04/19/2020   Procedure: COLONOSCOPY WITH BIOPSY;  Surgeon: [name], MD;  Location: [place];  Service: Endoscopy;  Laterality: N/A;  priority 4  . EYE SURGERY Left 07/2019   retina hole surgery  . MASTECTOMY Left 2003  . OVARIAN CYST REMOVAL Right   . POLYPECTOMY N/A 04/19/2020   Procedure: POLYPECTOMY;  Surgeon: [name], MD;  Location: [place];  Service: Endoscopy;  Laterality: N/A;  . REDUCTION MAMMAPLASTY Right   . TONSILLECTOMY    . TRIGGER FINGER RELEASE      Family  History  Problem Relation Age of Onset  . Cancer Sister        breast cancer   . Breast cancer Sister   . Cancer Cousin        m 1st cousin breast and pancreatic ca  . Breast cancer Cousin   . Cancer Cousin        breast m 1st cousin  . Breast cancer Cousin   . Cancer Cousin        m 1st cousin breast   . Breast cancer Cousin   . Cancer Cousin        m 1st cousin breast   . Breast cancer Cousin   . Cancer Mother        colon cancer   Social History:  reports that she quit smoking about 52 years ago. She has a 0.75 pack-year smoking history. She has never used smokeless tobacco. She reports current alcohol use of about 1.0 standard drink of alcohol per week. She reports that she does not use drugs.  Allergies: No Known Allergies  Medications Prior to Admission  Medication Sig Dispense Refill  . amLODipine (NORVASC) 2.5 MG tablet TAKE 1 TABLET DAILY 90 tablet 3  . Calcium Carbonate (CALCIUM 600 PO) Take 1 tablet by mouth daily.    Marland Kitchen lisinopril-hydrochlorothiazide (ZESTORETIC) 20-12.5 MG tablet TAKE 2 TABLETS DAILY IN THE MORNING 180  tablet 3  . Misc Natural Products (JOINT HEALTH PO) Take by mouth.    . Multiple Vitamin (MULTI-VITAMIN DAILY PO) Multi Vitamin    . Multiple Vitamins-Minerals (VITAMIN D3 COMPLETE PO) Take 2,000 Units by mouth daily.    . Omega-3 Fatty Acids (FISH OIL PO) Take by mouth.    . pravastatin (PRAVACHOL) 40 MG tablet TAKE 1 TABLET AT BEDTIME 90 tablet 3  . venlafaxine XR (EFFEXOR-XR) 150 MG 24 hr capsule TAKE 1 CAPSULE DAILY WITH BREAKFAST 90 capsule 3    Results for orders placed or performed during the hospital encounter of 09/20/20 (from the past 48 hour(s))  SARS CORONAVIRUS 2 (TAT 6-24 HRS) Nasopharyngeal Nasopharyngeal Swab     Status: None   Collection Time: 09/20/20  3:30 PM   Specimen: Nasopharyngeal Swab  Result Value Ref Range   SARS Coronavirus 2 NEGATIVE NEGATIVE    Comment: (NOTE) SARS-CoV-2 target nucleic acids are NOT DETECTED.  The  SARS-CoV-2 RNA is generally detectable in upper and lower respiratory specimens during the acute phase of infection. Negative results do not preclude SARS-CoV-2 infection, do not rule out co-infections with other pathogens, and should not be used as the sole basis for treatment or other patient management decisions. Negative results must be combined with clinical observations, patient history, and epidemiological information. The expected result is Negative.  Fact Sheet for Patients: SugarRoll.be  Fact Sheet for Healthcare Providers: https://www.woods-mathews.com/  This test is not yet approved or cleared by the [place] FDA and  has been authorized for detection and/or diagnosis of SARS-CoV-2 by FDA under an Emergency Use Authorization (EUA). This EUA will remain  in effect (meaning this test can be used) for the duration of the COVID-19 declaration under Se ction 564(b)(1) of the Act, 21 U.S.C. section 360bbb-3(b)(1), unless the authorization is terminated or revoked sooner.  Performed at [facility], [place] [address] [place], [place] [address]    No results found.  Review of Systems  All other systems reviewed and are negative.   Blood pressure (!) 197/96, pulse 74, temperature 98.2 F (36.8 C), temperature source Oral, resp. rate 14, height 5\' 4"  (1.626 m), weight 88 kg, SpO2 98 %. Physical Exam Constitutional:      Appearance: Normal appearance. She is normal weight.  HENT:     Head: Normocephalic and atraumatic.     Right Ear: External ear normal.     Left Ear: External ear normal.     Nose: Nose normal.     Mouth/Throat:     Mouth: Mucous membranes are moist.     Pharynx: Oropharynx is clear.  Eyes:     Extraocular Movements: Extraocular movements intact.     Conjunctiva/sclera: Conjunctivae normal.     Pupils: Pupils are equal, round, and reactive to light.  Cardiovascular:     Rate and Rhythm: Normal rate.   Pulmonary:     Effort: Pulmonary effort is normal.  Musculoskeletal:     Cervical back: Normal range of motion.  Skin:    General: Skin is warm and dry.  Neurological:     General: No focal deficit present.     Mental Status: She is alert and oriented to person, place, and time.  Psychiatric:        Mood and Affect: Mood normal.        Behavior: Behavior normal.        Thought Content: Thought content normal.        Judgment: Judgment normal.  Assessment/Plan Obstructive sleep apnea  To OR for DISE.  Melida Quitter, MD 09/22/2020, 11:23 AM

## 2020-09-22 NOTE — Brief Op Note (Signed)
09/22/2020  12:52 PM  PATIENT:  Tiffany Jarvis  70 y.o. female  PRE-OPERATIVE DIAGNOSIS:  sleep apnea  POST-OPERATIVE DIAGNOSIS:  sleep apnea  PROCEDURE:  Procedure(s): DRUG INDUCED ENDOSCOPY (N/A)  SURGEON:  Surgeon(s) and Role:    Melida Quitter, MD - Primary  PHYSICIAN ASSISTANT:   ASSISTANTS: none   ANESTHESIA:   IV sedation  EBL: None  BLOOD ADMINISTERED:none  DRAINS: none   LOCAL MEDICATIONS USED:  NONE  SPECIMEN:  No Specimen  DISPOSITION OF SPECIMEN:  N/A  COUNTS:  YES  TOURNIQUET:  * No tourniquets in log *  DICTATION: .Note written in EPIC  PLAN OF CARE: Discharge to home after PACU  PATIENT DISPOSITION:  PACU - hemodynamically stable.   Delay start of Pharmacological VTE agent (>24hrs) due to surgical blood loss or risk of bleeding: no

## 2020-09-23 ENCOUNTER — Encounter (HOSPITAL_BASED_OUTPATIENT_CLINIC_OR_DEPARTMENT_OTHER): Payer: Self-pay | Admitting: Otolaryngology

## 2020-09-29 ENCOUNTER — Ambulatory Visit: Payer: Medicare Other | Admitting: Radiation Oncology

## 2020-10-08 ENCOUNTER — Other Ambulatory Visit: Payer: Self-pay | Admitting: Otolaryngology

## 2020-10-28 DIAGNOSIS — E785 Hyperlipidemia, unspecified: Secondary | ICD-10-CM

## 2020-10-28 DIAGNOSIS — R232 Flushing: Secondary | ICD-10-CM

## 2020-10-28 DIAGNOSIS — I10 Essential (primary) hypertension: Secondary | ICD-10-CM

## 2020-11-01 MED ORDER — VENLAFAXINE HCL ER 150 MG PO CP24: ORAL_CAPSULE | ORAL | 3 refills | Status: DC

## 2020-11-01 MED ORDER — PRAVASTATIN SODIUM 40 MG PO TABS: 40.0000 mg | ORAL_TABLET | Freq: Every day | ORAL | 1 refills | Status: DC

## 2020-11-01 MED ORDER — AMLODIPINE BESYLATE 2.5 MG PO TABS: 2.5000 mg | ORAL_TABLET | Freq: Every day | ORAL | 1 refills | Status: DC

## 2020-11-01 NOTE — Addendum Note (Signed)
Addended by: Adair Laundry on: 11/01/2020 02:58 PM   Modules accepted: Orders

## 2020-11-16 MED ORDER — LISINOPRIL-HYDROCHLOROTHIAZIDE 20-12.5 MG PO TABS: ORAL_TABLET | ORAL | 1 refills | Status: DC

## 2020-11-16 NOTE — Addendum Note (Signed)
Addended by: Adair Laundry on: 11/16/2020 08:04 AM   Modules accepted: Orders

## 2020-12-06 ENCOUNTER — Encounter (HOSPITAL_BASED_OUTPATIENT_CLINIC_OR_DEPARTMENT_OTHER): Payer: Self-pay | Admitting: Otolaryngology

## 2020-12-06 ENCOUNTER — Other Ambulatory Visit: Payer: Self-pay

## 2020-12-14 ENCOUNTER — Encounter (HOSPITAL_BASED_OUTPATIENT_CLINIC_OR_DEPARTMENT_OTHER)
Admission: RE | Admit: 2020-12-14 | Discharge: 2020-12-14 | Disposition: A | Discharge status: Home / Self Care | Payer: Medicare Other | Source: Ambulatory Visit | Attending: Otolaryngology | Admitting: Otolaryngology

## 2020-12-14 DIAGNOSIS — Z79899 Other long term (current) drug therapy: Secondary | ICD-10-CM | POA: Diagnosis not present

## 2020-12-14 DIAGNOSIS — G4733 Obstructive sleep apnea (adult) (pediatric): Secondary | ICD-10-CM | POA: Diagnosis not present

## 2020-12-14 LAB — BASIC METABOLIC PANEL
Anion gap: 6 (ref 5–15)
BUN: 14 mg/dL (ref 8–23)
CO2: 28 mmol/L (ref 22–32)
Calcium: 9.4 mg/dL (ref 8.9–10.3)
Chloride: 103 mmol/L (ref 98–111)
Creatinine, Ser: 0.7 mg/dL (ref 0.44–1.00)
GFR, Estimated: >60 mL/min (ref >60)
Glucose, Bld: 89 mg/dL (ref 70–99)
Potassium: 4.5 mmol/L (ref 3.5–5.1)
Sodium: 137 mmol/L (ref 135–145)

## 2020-12-14 NOTE — Progress Notes (Signed)
Sent text reminding pt to come in for lab work.  

## 2020-12-15 ENCOUNTER — Encounter (HOSPITAL_BASED_OUTPATIENT_CLINIC_OR_DEPARTMENT_OTHER): Payer: Self-pay | Admitting: Otolaryngology

## 2020-12-15 ENCOUNTER — Ambulatory Visit (HOSPITAL_COMMUNITY): Payer: Medicare Other

## 2020-12-15 ENCOUNTER — Ambulatory Visit (HOSPITAL_BASED_OUTPATIENT_CLINIC_OR_DEPARTMENT_OTHER)
Admission: RE | Admit: 2020-12-15 | Discharge: 2020-12-15 | Disposition: A | Discharge status: Home / Self Care | Payer: Medicare Other | Attending: Otolaryngology | Admitting: Otolaryngology

## 2020-12-15 ENCOUNTER — Encounter (HOSPITAL_BASED_OUTPATIENT_CLINIC_OR_DEPARTMENT_OTHER)
Admission: RE | Disposition: A | Discharge status: Home / Self Care | Payer: Self-pay | Source: Home / Self Care | Attending: Otolaryngology

## 2020-12-15 ENCOUNTER — Ambulatory Visit (HOSPITAL_BASED_OUTPATIENT_CLINIC_OR_DEPARTMENT_OTHER): Payer: Medicare Other | Admitting: Certified Registered"

## 2020-12-15 ENCOUNTER — Other Ambulatory Visit: Payer: Self-pay

## 2020-12-15 DIAGNOSIS — Z79899 Other long term (current) drug therapy: Secondary | ICD-10-CM | POA: Diagnosis not present

## 2020-12-15 DIAGNOSIS — G4733 Obstructive sleep apnea (adult) (pediatric): Secondary | ICD-10-CM | POA: Diagnosis not present

## 2020-12-15 HISTORY — PX: IMPLANTATION OF HYPOGLOSSAL NERVE STIMULATOR: SHX6827

## 2020-12-15 SURGERY — INSERTION, HYPOGLOSSAL NERVE STIMULATOR
Anesthesia: General | Site: Neck | Laterality: Right

## 2020-12-15 MED ORDER — PHENYLEPHRINE HCL-NACL 10-0.9 MG/250ML-% IV SOLN
INTRAVENOUS | Status: DC | PRN
  Administered 2020-12-15: 40 ug/min via INTRAVENOUS

## 2020-12-15 MED ORDER — FENTANYL CITRATE (PF) 100 MCG/2ML IJ SOLN
INTRAMUSCULAR | Status: AC
  Filled 2020-12-15: qty 2

## 2020-12-15 MED ORDER — DEXAMETHASONE SODIUM PHOSPHATE 10 MG/ML IJ SOLN
INTRAMUSCULAR | Status: AC
  Filled 2020-12-15: qty 2

## 2020-12-15 MED ORDER — AMISULPRIDE (ANTIEMETIC) 5 MG/2ML IV SOLN: 10.0000 mg | Freq: Once | INTRAVENOUS | Status: DC | PRN

## 2020-12-15 MED ORDER — PROMETHAZINE HCL 25 MG/ML IJ SOLN
6.2500 mg | INTRAMUSCULAR | Status: DC | PRN
Start: 2020-12-15 — End: 2020-12-15

## 2020-12-15 MED ORDER — SUCCINYLCHOLINE CHLORIDE 20 MG/ML IJ SOLN
INTRAMUSCULAR | Status: DC | PRN
  Administered 2020-12-15: 100 mg via INTRAVENOUS

## 2020-12-15 MED ORDER — ACETAMINOPHEN 325 MG PO TABS
325.0000 mg | ORAL_TABLET | ORAL | Status: DC | PRN
  Administered 2020-12-15: 325 mg via ORAL

## 2020-12-15 MED ORDER — GLYCOPYRROLATE 0.2 MG/ML IJ SOLN
INTRAMUSCULAR | Status: DC | PRN
  Administered 2020-12-15: .1 mg via INTRAVENOUS

## 2020-12-15 MED ORDER — SUCCINYLCHOLINE CHLORIDE 200 MG/10ML IV SOSY
PREFILLED_SYRINGE | INTRAVENOUS | Status: AC
  Filled 2020-12-15: qty 10

## 2020-12-15 MED ORDER — ACETAMINOPHEN 10 MG/ML IV SOLN: 1000.0000 mg | Freq: Once | INTRAVENOUS | Status: DC | PRN

## 2020-12-15 MED ORDER — LIDOCAINE HCL (PF) 2 % IJ SOLN
INTRAMUSCULAR | Status: AC
  Filled 2020-12-15: qty 15

## 2020-12-15 MED ORDER — LIDOCAINE 2% (20 MG/ML) 5 ML SYRINGE
INTRAMUSCULAR | Status: DC | PRN
  Administered 2020-12-15: 60 mg via INTRAVENOUS

## 2020-12-15 MED ORDER — FENTANYL CITRATE (PF) 100 MCG/2ML IJ SOLN
25.0000 ug | INTRAMUSCULAR | Status: DC | PRN
  Administered 2020-12-15: 25 ug via INTRAVENOUS

## 2020-12-15 MED ORDER — LIDOCAINE-EPINEPHRINE 1 %-1:100000 IJ SOLN
INTRAMUSCULAR | Status: AC
  Filled 2020-12-15: qty 1

## 2020-12-15 MED ORDER — DEXMEDETOMIDINE (PRECEDEX) IN NS 20 MCG/5ML (4 MCG/ML) IV SYRINGE
PREFILLED_SYRINGE | INTRAVENOUS | Status: AC
  Filled 2020-12-15: qty 5

## 2020-12-15 MED ORDER — PROPOFOL 500 MG/50ML IV EMUL
INTRAVENOUS | Status: DC | PRN
  Administered 2020-12-15: 50 ug/kg/min via INTRAVENOUS

## 2020-12-15 MED ORDER — PROPOFOL 10 MG/ML IV BOLUS
INTRAVENOUS | Status: DC | PRN
  Administered 2020-12-15: 150 mg via INTRAVENOUS

## 2020-12-15 MED ORDER — DEXAMETHASONE SODIUM PHOSPHATE 4 MG/ML IJ SOLN
INTRAMUSCULAR | Status: DC | PRN
  Administered 2020-12-15: 5 mg via INTRAVENOUS

## 2020-12-15 MED ORDER — GLYCOPYRROLATE PF 0.2 MG/ML IJ SOSY
PREFILLED_SYRINGE | INTRAMUSCULAR | Status: AC
  Filled 2020-12-15: qty 1

## 2020-12-15 MED ORDER — LIDOCAINE-EPINEPHRINE 1 %-1:100000 IJ SOLN
INTRAMUSCULAR | Status: DC | PRN
  Administered 2020-12-15: 6 mL

## 2020-12-15 MED ORDER — PROPOFOL 500 MG/50ML IV EMUL
INTRAVENOUS | Status: AC
  Filled 2020-12-15: qty 150

## 2020-12-15 MED ORDER — CEFAZOLIN SODIUM-DEXTROSE 2-4 GM/100ML-% IV SOLN
2.0000 g | INTRAVENOUS | Status: AC
  Administered 2020-12-15: 2 g via INTRAVENOUS

## 2020-12-15 MED ORDER — MIDAZOLAM HCL 2 MG/2ML IJ SOLN
INTRAMUSCULAR | Status: AC
  Filled 2020-12-15: qty 2

## 2020-12-15 MED ORDER — CEFAZOLIN SODIUM-DEXTROSE 2-4 GM/100ML-% IV SOLN
INTRAVENOUS | Status: AC
  Filled 2020-12-15: qty 100

## 2020-12-15 MED ORDER — ACETAMINOPHEN 160 MG/5ML PO SOLN: 325.0000 mg | ORAL | Status: DC | PRN

## 2020-12-15 MED ORDER — MIDAZOLAM HCL 5 MG/5ML IJ SOLN
INTRAMUSCULAR | Status: DC | PRN
  Administered 2020-12-15: 2 mg via INTRAVENOUS

## 2020-12-15 MED ORDER — HYDROCODONE-ACETAMINOPHEN 5-325 MG PO TABS: 1.0000 | ORAL_TABLET | Freq: Four times a day (QID) | ORAL | 0 refills | Status: DC | PRN

## 2020-12-15 MED ORDER — DEXMEDETOMIDINE (PRECEDEX) IN NS 20 MCG/5ML (4 MCG/ML) IV SYRINGE
PREFILLED_SYRINGE | INTRAVENOUS | Status: DC | PRN
  Administered 2020-12-15: 8 ug via INTRAVENOUS

## 2020-12-15 MED ORDER — FENTANYL CITRATE (PF) 100 MCG/2ML IJ SOLN
INTRAMUSCULAR | Status: DC | PRN
  Administered 2020-12-15: 50 ug via INTRAVENOUS
  Administered 2020-12-15: 100 ug via INTRAVENOUS
  Administered 2020-12-15: 50 ug via INTRAVENOUS

## 2020-12-15 MED ORDER — SCOPOLAMINE 1 MG/3DAYS TD PT72: 1.0000 | MEDICATED_PATCH | TRANSDERMAL | Status: DC

## 2020-12-15 MED ORDER — ONDANSETRON HCL 4 MG/2ML IJ SOLN
INTRAMUSCULAR | Status: AC
  Filled 2020-12-15: qty 12

## 2020-12-15 MED ORDER — ONDANSETRON HCL 4 MG/2ML IJ SOLN
INTRAMUSCULAR | Status: DC | PRN
  Administered 2020-12-15: 4 mg via INTRAVENOUS

## 2020-12-15 MED ORDER — ACETAMINOPHEN 325 MG PO TABS
ORAL_TABLET | ORAL | Status: AC
  Filled 2020-12-15: qty 1

## 2020-12-15 MED ORDER — LACTATED RINGERS IV SOLN: INTRAVENOUS | Status: DC

## 2020-12-15 MED ORDER — OXYCODONE HCL 5 MG/5ML PO SOLN
5.0000 mg | Freq: Once | ORAL | Status: AC | PRN
Start: 2020-12-15 — End: 2020-12-15
  Administered 2020-12-15: 5 mg via ORAL

## 2020-12-15 MED ORDER — OXYCODONE HCL 5 MG/5ML PO SOLN
ORAL | Status: AC
  Filled 2020-12-15: qty 5

## 2020-12-15 MED ORDER — OXYCODONE HCL 5 MG PO TABS
5.0000 mg | ORAL_TABLET | Freq: Once | ORAL | Status: AC | PRN
Start: 2020-12-15 — End: 2020-12-15

## 2020-12-15 SURGICAL SUPPLY — 63 items
ACC NRSTM 4 TRQ WRNCH STRL (MISCELLANEOUS)
ADH SKN CLS APL DERMABOND .7 (GAUZE/BANDAGES/DRESSINGS) ×2
BLADE CLIPPER SURG (BLADE) IMPLANT
BLADE SURG 15 STRL LF DISP TIS (BLADE) ×1 IMPLANT
BLADE SURG 15 STRL SS (BLADE) ×2
CANISTER SUCT 1200ML W/VALVE (MISCELLANEOUS) ×2 IMPLANT
CORD BIPOLAR FORCEPS 12FT (ELECTRODE) ×2 IMPLANT
COVER PROBE W GEL 5X96 (DRAPES) ×2 IMPLANT
COVER WAND RF STERILE (DRAPES) ×2 IMPLANT
DERMABOND ADVANCED (GAUZE/BANDAGES/DRESSINGS) ×2
DERMABOND ADVANCED .7 DNX12 (GAUZE/BANDAGES/DRESSINGS) ×2 IMPLANT
DRAPE C-ARM 35X43 STRL (DRAPES) ×2 IMPLANT
DRAPE HEAD BAR (DRAPES) IMPLANT
DRAPE INCISE IOBAN 66X45 STRL (DRAPES) ×2 IMPLANT
DRAPE MICROSCOPE WILD 40.5X102 (DRAPES) ×2 IMPLANT
DRAPE UTILITY XL STRL (DRAPES) ×2 IMPLANT
DRSG TEGADERM 2-3/8X2-3/4 SM (GAUZE/BANDAGES/DRESSINGS) ×2 IMPLANT
DRSG TEGADERM 4X4.75 (GAUZE/BANDAGES/DRESSINGS) ×2 IMPLANT
ELECT COATED BLADE 2.86 ST (ELECTRODE) ×2 IMPLANT
ELECT EMG 18 NIMS (NEUROSURGERY SUPPLIES) ×2
ELECT REM PT RETURN 9FT ADLT (ELECTROSURGICAL) ×2
ELECTRODE EMG 18 NIMS (NEUROSURGERY SUPPLIES) ×1 IMPLANT
ELECTRODE REM PT RTRN 9FT ADLT (ELECTROSURGICAL) ×1 IMPLANT
FORCEPS BIPOLAR SPETZLER 8 1.0 (NEUROSURGERY SUPPLIES) ×2 IMPLANT
GAUZE 4X4 16PLY RFD (DISPOSABLE) ×2 IMPLANT
GAUZE SPONGE 4X4 12PLY STRL (GAUZE/BANDAGES/DRESSINGS) ×2 IMPLANT
GENERATOR PULSE INSPIRE (Generator) ×2 IMPLANT
GLOVE SURG ENC MOIS LTX SZ6.5 (GLOVE) IMPLANT
GLOVE SURG ENC MOIS LTX SZ7.5 (GLOVE) ×2 IMPLANT
GOWN STRL REUS W/ TWL LRG LVL3 (GOWN DISPOSABLE) ×3 IMPLANT
GOWN STRL REUS W/TWL LRG LVL3 (GOWN DISPOSABLE) ×6
IV CATH 18G SAFETY (IV SOLUTION) ×2 IMPLANT
KIT NEURO ACCESSORY W/WRENCH (MISCELLANEOUS) IMPLANT
LEAD SENSING RESP INSPIRE (Lead) ×2 IMPLANT
LEAD SLEEP STIMULATION INSPIRE (Lead) ×2 IMPLANT
LOOP VESSEL MAXI BLUE (MISCELLANEOUS) ×2 IMPLANT
LOOP VESSEL MINI RED (MISCELLANEOUS) ×2 IMPLANT
MARKER SKIN DUAL TIP RULER LAB (MISCELLANEOUS) ×4 IMPLANT
NEEDLE HYPO 25X1 1.5 SAFETY (NEEDLE) ×2 IMPLANT
NS IRRIG 1000ML POUR BTL (IV SOLUTION) ×2 IMPLANT
PACK BASIN DAY SURGERY FS (CUSTOM PROCEDURE TRAY) ×2 IMPLANT
PACK ENT DAY SURGERY (CUSTOM PROCEDURE TRAY) ×2 IMPLANT
PASSER CATH 36 CODMAN DISP (NEUROSURGERY SUPPLIES) ×2 IMPLANT
PASSER CATH 38CM DISP (INSTRUMENTS) IMPLANT
PENCIL SMOKE EVACUATOR (MISCELLANEOUS) ×2 IMPLANT
PROBE NERVE STIMULATOR (NEUROSURGERY SUPPLIES) ×2 IMPLANT
REMOTE CONTROL SLEEP INSPIRE (MISCELLANEOUS) ×2 IMPLANT
SET WALTER ACTIVATION W/DRAPE (SET/KITS/TRAYS/PACK) ×2 IMPLANT
SLEEVE SCD COMPRESS KNEE MED (STOCKING) ×2 IMPLANT
SPONGE INTESTINAL PEANUT (DISPOSABLE) ×2 IMPLANT
STAPLER VISISTAT 35W (STAPLE) IMPLANT
SUT SILK 2 0 SH (SUTURE) ×2 IMPLANT
SUT SILK 3 0 REEL (SUTURE) ×2 IMPLANT
SUT SILK 3 0 SH 30 (SUTURE) ×2 IMPLANT
SUT SILK 3-0 (SUTURE) ×4
SUT SILK 3-0 RB1 30XBRD (SUTURE) ×2
SUT VIC AB 3-0 SH 27 (SUTURE) ×2
SUT VIC AB 3-0 SH 27X BRD (SUTURE) ×1 IMPLANT
SUT VIC AB 4-0 PS2 27 (SUTURE) ×2 IMPLANT
SUTURE SILK 3-0 RB1 30XBRD (SUTURE) ×2 IMPLANT
SYR 10ML LL (SYRINGE) ×2 IMPLANT
SYR BULB EAR ULCER 3OZ GRN STR (SYRINGE) ×2 IMPLANT
TOWEL GREEN STERILE FF (TOWEL DISPOSABLE) ×4 IMPLANT

## 2020-12-15 NOTE — Anesthesia Postprocedure Evaluation (Signed)
Anesthesia Post Note  Patient: Tiffany Jarvis  Procedure(s) Performed: IMPLANTATION OF HYPOGLOSSAL NERVE STIMULATOR (Right Neck)     Patient location during evaluation: PACU Anesthesia Type: General Level of consciousness: awake and alert Pain management: pain level controlled Vital Signs Assessment: post-procedure vital signs reviewed and stable Respiratory status: spontaneous breathing, nonlabored ventilation, respiratory function stable and patient connected to nasal cannula oxygen Cardiovascular status: blood pressure returned to baseline and stable Postop Assessment: no apparent nausea or vomiting Anesthetic complications: no   No complications documented.  Last Vitals:  Vitals:   12/15/20 1230 12/15/20 1250  BP: (!) 153/90 (!) 159/90  Pulse: 80 77  Resp: 13 16  Temp:  37.2 C  SpO2: 94% 93%    Last Pain:  Vitals:   12/15/20 1208  TempSrc:   PainSc: [place]

## 2020-12-15 NOTE — Anesthesia Procedure Notes (Addendum)
Procedure Name: Intubation Date/Time: 12/15/2020 9:42 AM Performed by: Signe Colt, CRNA Pre-anesthesia Checklist: Patient identified, Emergency Drugs available, Suction available and Patient being monitored Patient Re-evaluated:Patient Re-evaluated prior to induction Oxygen Delivery Method: Circle system utilized Preoxygenation: Pre-oxygenation with 100% oxygen Induction Type: IV induction Ventilation: Mask ventilation without difficulty Laryngoscope Size: Glidescope, Mac, 3 and 4 Grade View: Grade II Tube type: Oral Tube size: 7.0 mm Number of attempts: 3 Airway Equipment and Method: Stylet,  Oral airway and Video-laryngoscopy Placement Confirmation: ETT inserted through vocal cords under direct vision,  positive ETCO2 and breath sounds checked- equal and bilateral Secured at: 21 cm Tube secured with: Tape Dental Injury: Teeth and Oropharynx as per pre-operative assessment  Difficulty Due To: Difficulty was anticipated, Difficult Airway- due to anterior larynx and Difficult Airway- due to limited oral opening Comments: Patient for hypoglossal nerve stimulator. Narrow oral opening, narrow palate, TM distance 1 finger width. Smooth IV induction, easy mask, DL x 1 by T Celsa Nordahl partial VC visualized but unable to place tube, mask ventilation,  second DL per Dr Smith Robert with Mac 4 also visualization VC used bougie - immediate recognition of esph intubation, mask ventilation,  third DL with glide scope by T Zak Gondek  VC visualized with glide scope stylet easy passage of 7.0 ETT, teeth and lips unchanged, BBS, + ETCO2

## 2020-12-15 NOTE — Discharge Instructions (Signed)
*  You had 5mg  of Oxycodone at 12:55pm   Post Anesthesia Home Care Instructions  Activity: Get plenty of rest for the remainder of the day. A responsible individual must stay with you for 24 hours following the procedure.  For the next 24 hours, DO NOT: -Drive a car -Paediatric nurse -Drink alcoholic beverages -Take any medication unless instructed by your physician -Make any legal decisions or sign important papers.  Meals: Start with liquid foods such as gelatin or soup. Progress to regular foods as tolerated. Avoid greasy, spicy, heavy foods. If nausea and/or vomiting occur, drink only clear liquids until the nausea and/or vomiting subsides. Call your physician if vomiting continues.  Special Instructions/Symptoms: Your throat may feel dry or sore from the anesthesia or the breathing tube placed in your throat during surgery. If this causes discomfort, gargle with warm salt water. The discomfort should disappear within 24 hours.  If you had a scopolamine patch placed behind your ear for the management of post- operative nausea and/or vomiting:  1. The medication in the patch is effective for 72 hours, after which it should be removed.  Wrap patch in a tissue and discard in the trash. Wash hands thoroughly with soap and water. 2. You may remove the patch earlier than 72 hours if you experience unpleasant side effects which may include dry mouth, dizziness or visual disturbances. 3. Avoid touching the patch. Wash your hands with soap and water after contact with the patch.

## 2020-12-15 NOTE — Op Note (Signed)

## 2020-12-15 NOTE — Anesthesia Preprocedure Evaluation (Addendum)
Anesthesia Evaluation  Patient identified by MRN, date of birth, ID band Patient awake    Reviewed: Allergy & Precautions, NPO status , Patient's Chart, lab work & pertinent test results  Airway Mallampati: IV  TM Distance: <3 FB     Dental  (+) Teeth Intact, Dental Advisory Given   Pulmonary sleep apnea , former smoker,    Pulmonary exam normal        Cardiovascular hypertension, Pt. on medications  Rhythm:Regular Rate:Normal     Neuro/Psych  Headaches, PSYCHIATRIC DISORDERS Anxiety Depression    GI/Hepatic negative GI ROS, Neg liver ROS,   Endo/Other  negative endocrine ROS  Renal/GU negative Renal ROS     Musculoskeletal negative musculoskeletal ROS (+)   Abdominal Normal abdominal exam  (+)   Peds  Hematology negative hematology ROS (+)   Anesthesia Other Findings   Reproductive/Obstetrics                            Anesthesia Physical Anesthesia Plan  ASA: II  Anesthesia Plan: General   Post-op Pain Management:    Induction: Intravenous  PONV Risk Score and Plan: 4 or greater and Ondansetron, Dexamethasone, Midazolam and Scopolamine patch - Pre-op  Airway Management Planned: Oral ETT  Additional Equipment: None  Intra-op Plan:   Post-operative Plan: Extubation in OR  Informed Consent: I have reviewed the patients History and Physical, chart, labs and discussed the procedure including the risks, benefits and alternatives for the proposed anesthesia with the patient or authorized representative who has indicated his/her understanding and acceptance.     Dental advisory given  Plan Discussed with: CRNA  Anesthesia Plan Comments:        Anesthesia Quick Evaluation

## 2020-12-15 NOTE — H&P (Signed)
Tiffany Jarvis is an 70 y.o. female.   Chief Complaint: Sleep apnea HPI: 70 year old female with obstructive sleep apnea who has had difficulty tolerating CPAP.  She presents for hypoglossal nerve placement.  Past Medical History:  Diagnosis Date  . Allergy   . 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 Colon polyps   . Depression   . History of chicken pox   . Hyperlipidemia   . Hypertension   . Influenza due to influenza virus, type B 08/25/2018  . OSA (obstructive sleep apnea)    not able to tolerate cpap or dental mouth piece   . Vertigo    CT neck and head negative significant CAS (1 episode - "yrs ago")    Past Surgical History:  Procedure Laterality Date  . BREAST BIOPSY Left 2003   positive  . BREAST SURGERY     L mastectomy ~2001/2002 and reconstruction  . CATARACT EXTRACTION W/PHACO Left 01/28/2020   Procedure: CATARACT EXTRACTION PHACO AND INTRAOCULAR LENS PLACEMENT ([place]) LEFT;  Surgeon: Leandrew Koyanagi, MD;  Location: [place];  Service: Ophthalmology;  Laterality: Left;  5.61 0:53.0 10.6%  . CATARACT EXTRACTION W/PHACO Right 03/03/2020   Procedure: CATARACT EXTRACTION PHACO AND INTRAOCULAR LENS PLACEMENT ([place]) RIGHT;  Surgeon: Leandrew Koyanagi, MD;  Location: [place];  Service: Ophthalmology;  Laterality: Right;  2.52 0:41.6 6.0%  . CESAREAN SECTION     x 3 1979, 1982, 1992   . COLONOSCOPY    . COLONOSCOPY WITH PROPOFOL N/A 04/19/2020   Procedure: COLONOSCOPY WITH BIOPSY;  Surgeon: [name], MD;  Location: [place];  Service: Endoscopy;  Laterality: N/A;  priority 4  . DRUG INDUCED ENDOSCOPY N/A 09/22/2020   Procedure: DRUG INDUCED ENDOSCOPY;  Surgeon: Melida Quitter, MD;  Location: [place];  Service: ENT;  Laterality: N/A;  . EYE SURGERY Left 07/2019   retina hole surgery  . MASTECTOMY Left 2003  . OVARIAN CYST REMOVAL Right   . POLYPECTOMY N/A 04/19/2020   Procedure: POLYPECTOMY;   Surgeon: [name], MD;  Location: [place];  Service: Endoscopy;  Laterality: N/A;  . REDUCTION MAMMAPLASTY Right   . TONSILLECTOMY    . TRIGGER FINGER RELEASE      Family History  Problem Relation Age of Onset  . Cancer Sister        breast cancer   . Breast cancer Sister   . Cancer Cousin        m 1st cousin breast and pancreatic ca  . Breast cancer Cousin   . Cancer Cousin        breast m 1st cousin  . Breast cancer Cousin   . Cancer Cousin        m 1st cousin breast   . Breast cancer Cousin   . Cancer Cousin        m 1st cousin breast   . Breast cancer Cousin   . Cancer Mother        colon cancer   Social History:  reports that she quit smoking about 52 years ago. She has a 0.75 pack-year smoking history. She has never used smokeless tobacco. She reports current alcohol use of about 1.0 standard drink of alcohol per week. She reports that she does not use drugs.  Allergies: No Known Allergies  Medications Prior to Admission  Medication Sig Dispense Refill  . amLODipine (NORVASC) 2.5 MG tablet Take 1 tablet (2.5 mg total)  by mouth daily. 90 tablet 1  . Calcium Carbonate (CALCIUM 600 PO) Take 1 tablet by mouth daily.    Marland Kitchen lisinopril-hydrochlorothiazide (ZESTORETIC) 20-12.5 MG tablet TAKE 2 TABLETS DAILY IN THE MORNING 180 tablet 1  . Misc Natural Products (JOINT HEALTH PO) Take by mouth.    . Multiple Vitamin (MULTI-VITAMIN DAILY PO) Multi Vitamin    . Multiple Vitamins-Minerals (VITAMIN D3 COMPLETE PO) Take 2,000 Units by mouth daily.    . Omega-3 Fatty Acids (FISH OIL PO) Take by mouth.    . polycarbophil (FIBERCON) 625 MG tablet Take 625 mg by mouth daily.    . pravastatin (PRAVACHOL) 40 MG tablet Take 1 tablet (40 mg total) by mouth at bedtime. 90 tablet 1  . venlafaxine XR (EFFEXOR-XR) 150 MG 24 hr capsule TAKE 1 CAPSULE DAILY WITH BREAKFAST 90 capsule 3    Results for orders placed or performed during the hospital encounter of 12/15/20 (from the past  48 hour(s))  Basic metabolic panel     Status: None   Collection Time: 12/14/20 12:34 PM  Result Value Ref Range   Sodium 137 135 - 145 mmol/L   Potassium 4.5 3.5 - 5.1 mmol/L   Chloride 103 98 - 111 mmol/L   CO2 28 22 - 32 mmol/L   Glucose, Bld 89 70 - 99 mg/dL    Comment: Glucose reference range applies only to samples taken after fasting for at least 8 hours.   BUN 14 8 - 23 mg/dL   Creatinine, Ser 0.70 0.44 - 1.00 mg/dL   Calcium 9.4 8.9 - 10.3 mg/dL   GFR, Estimated >60 >60 mL/min    Comment: (NOTE) Calculated using the CKD-EPI Creatinine Equation (2021)    Anion gap 6 5 - 15    Comment: Performed at [facility] [address], [place], [place] [address]   No results found.  Review of Systems  All other systems reviewed and are negative.   Blood pressure (!) 183/93, pulse 64, temperature 98.3 F (36.8 C), temperature source Oral, resp. rate 14, height 5\' 4"  (1.626 m), weight 89.6 kg, SpO2 98 %. Physical Exam Constitutional:      Appearance: Normal appearance. She is normal weight.  HENT:     Head: Normocephalic and atraumatic.     Right Ear: External ear normal.     Left Ear: External ear normal.     Nose: Nose normal.     Mouth/Throat:     Mouth: Mucous membranes are moist.     Pharynx: Oropharynx is clear.  Eyes:     Extraocular Movements: Extraocular movements intact.     Conjunctiva/sclera: Conjunctivae normal.     Pupils: Pupils are equal, round, and reactive to light.  Cardiovascular:     Rate and Rhythm: Normal rate.  Pulmonary:     Effort: Pulmonary effort is normal.  Musculoskeletal:     Cervical back: Normal range of motion.  Skin:    General: Skin is warm and dry.  Neurological:     General: No focal deficit present.     Mental Status: She is alert and oriented to person, place, and time.  Psychiatric:        Mood and Affect: Mood normal.        Behavior: Behavior normal.        Thought Content: Thought content normal.        Judgment:  Judgment normal.      Assessment/Plan Obstructive sleep apnea  To OR for  hypoglossal nerve stimulator placement.  Melida Quitter, MD 12/15/2020, 9:09 AM

## 2020-12-15 NOTE — Brief Op Note (Signed)
12/15/2020  11:19 AM  PATIENT:  Tiffany Jarvis  70 y.o. female  PRE-OPERATIVE DIAGNOSIS:  Sleep apnea  POST-OPERATIVE DIAGNOSIS:  Sleep apnea  PROCEDURE:  Procedure(s): IMPLANTATION OF HYPOGLOSSAL NERVE STIMULATOR (Right)  SURGEON:  Surgeon(s) and Role:    Melida Quitter, MD - Primary  PHYSICIAN ASSISTANT: Sallee Provencal  ASSISTANTS: none   ANESTHESIA:   general  EBL: Minimal  BLOOD ADMINISTERED:none  DRAINS: none   LOCAL MEDICATIONS USED:  LIDOCAINE   SPECIMEN:  No Specimen  DISPOSITION OF SPECIMEN:  N/A  COUNTS:  YES  TOURNIQUET:  * No tourniquets in log *  DICTATION: .Note written in EPIC  PLAN OF CARE: Discharge to home after PACU  PATIENT DISPOSITION:  PACU - hemodynamically stable.   Delay start of Pharmacological VTE agent (>24hrs) due to surgical blood loss or risk of bleeding: no

## 2020-12-15 NOTE — Transfer of Care (Signed)
Immediate Anesthesia Transfer of Care Note  Patient: Tiffany Jarvis  Procedure(s) Performed: IMPLANTATION OF HYPOGLOSSAL NERVE STIMULATOR (Right Neck)  Patient Location: PACU  Anesthesia Type:General  Level of Consciousness: drowsy and patient cooperative  Airway & Oxygen Therapy: Patient Spontanous Breathing and Patient connected to face mask oxygen  Post-op Assessment: Report given to RN and Post -op Vital signs reviewed and stable  Post vital signs: Reviewed and stable  Last Vitals:  Vitals Value Taken Time  BP    Temp    Pulse 80 12/15/20 1141  Resp 7 12/15/20 1141  SpO2 100 % 12/15/20 1141  Vitals shown include unvalidated device data.  Last Pain:  Vitals:   12/15/20 0820  TempSrc: Oral  PainSc: 0-No pain         Complications: No complications documented.

## 2020-12-16 ENCOUNTER — Encounter (HOSPITAL_BASED_OUTPATIENT_CLINIC_OR_DEPARTMENT_OTHER): Payer: Self-pay | Admitting: Otolaryngology

## 2021-02-02 ENCOUNTER — Encounter: Payer: Self-pay | Admitting: Radiation Oncology

## 2021-02-09 ENCOUNTER — Other Ambulatory Visit: Payer: Self-pay | Admitting: *Deleted

## 2021-02-09 ENCOUNTER — Other Ambulatory Visit: Payer: Self-pay | Admitting: Radiation Oncology

## 2021-02-09 DIAGNOSIS — Z86000 Personal history of in-situ neoplasm of breast: Secondary | ICD-10-CM

## 2021-02-27 ENCOUNTER — Other Ambulatory Visit: Payer: Self-pay | Admitting: Internal Medicine

## 2021-02-27 DIAGNOSIS — I10 Essential (primary) hypertension: Secondary | ICD-10-CM

## 2021-02-27 DIAGNOSIS — E785 Hyperlipidemia, unspecified: Secondary | ICD-10-CM

## 2021-03-01 ENCOUNTER — Other Ambulatory Visit: Payer: Self-pay | Admitting: Internal Medicine

## 2021-03-01 DIAGNOSIS — I10 Essential (primary) hypertension: Secondary | ICD-10-CM

## 2021-03-31 ENCOUNTER — Ambulatory Visit: Payer: Medicare Other | Admitting: Radiation Oncology

## 2021-04-01 ENCOUNTER — Ambulatory Visit
Admission: RE | Admit: 2021-04-01 | Discharge: 2021-04-01 | Disposition: A | Discharge status: Home / Self Care | Payer: Medicare Other | Source: Ambulatory Visit | Attending: Radiation Oncology | Admitting: Radiation Oncology

## 2021-04-01 ENCOUNTER — Other Ambulatory Visit: Payer: Self-pay

## 2021-04-01 DIAGNOSIS — Z1231 Encounter for screening mammogram for malignant neoplasm of breast: Secondary | ICD-10-CM | POA: Insufficient documentation

## 2021-04-01 DIAGNOSIS — Z86000 Personal history of in-situ neoplasm of breast: Secondary | ICD-10-CM | POA: Diagnosis present

## 2021-04-05 ENCOUNTER — Telehealth: Payer: Self-pay

## 2021-04-05 DIAGNOSIS — R197 Diarrhea, unspecified: Secondary | ICD-10-CM

## 2021-04-05 NOTE — Addendum Note (Signed)
Addended byElpidio Galea T on: 04/05/2021 05:04 PM   Modules accepted: Orders

## 2021-04-05 NOTE — Telephone Encounter (Signed)
Only test ordered was Cdiff for [facility].   So we can receive sterile cup from patient to send to lab.

## 2021-04-05 NOTE — Telephone Encounter (Signed)
Access nurse instructed patient to be evaluated within 24 hours. Patient stated they would comply. She currently has appointment with Mable Paris, FNP tomorrow.

## 2021-04-06 ENCOUNTER — Ambulatory Visit: Payer: Medicare Other | Admitting: Family

## 2021-04-06 ENCOUNTER — Ambulatory Visit
Admission: RE | Admit: 2021-04-06 | Discharge: 2021-04-06 | Disposition: A | Discharge status: Home / Self Care | Payer: Medicare Other | Source: Ambulatory Visit | Attending: Family | Admitting: Family

## 2021-04-06 ENCOUNTER — Other Ambulatory Visit: Payer: Self-pay

## 2021-04-06 ENCOUNTER — Encounter: Payer: Self-pay | Admitting: Family

## 2021-04-06 VITALS — BP 120/86 | HR 97 | Temp 95.8°F | Ht 63.5 in | Wt 192.8 lb

## 2021-04-06 DIAGNOSIS — R195 Other fecal abnormalities: Secondary | ICD-10-CM | POA: Diagnosis not present

## 2021-04-06 DIAGNOSIS — R197 Diarrhea, unspecified: Secondary | ICD-10-CM | POA: Diagnosis not present

## 2021-04-06 DIAGNOSIS — R1032 Left lower quadrant pain: Secondary | ICD-10-CM | POA: Diagnosis not present

## 2021-04-06 LAB — POCT I-STAT CREATININE: Creatinine, Ser: 0.9 mg/dL (ref 0.44–1.00)

## 2021-04-06 MED ORDER — IOHEXOL 350 MG/ML SOLN
80.0000 mL | Freq: Once | INTRAVENOUS | Status: AC | PRN
  Administered 2021-04-06: 80 mL via INTRAVENOUS

## 2021-04-06 NOTE — Patient Instructions (Signed)
Start probiotics.   This can either be by eating yogurt daily or taking a probiotic supplement over the counter such as Culturelle.It is important to re-colonize the gut with good bacteria and also to prevent any diarrheal infections associated with antibiotic use.   We will await results of CT and stool culture

## 2021-04-06 NOTE — Assessment & Plan Note (Signed)
Nonbloody loose stools as opposed to diarrhea.  She was prescribed amoxicillin, clindamycin last month. one episode today. Appears to be improving.  Patient is also nontoxic in appearance afebrile.  However on exam,  focal pain left lower quadrant.  With history of diverticula seen on colonoscopy, advised patient CT abdomen and pelvis to evaluate for diverticulitis.  Pending cdiff test.

## 2021-04-06 NOTE — Progress Notes (Signed)
Subjective:    Patient ID: Tiffany Jarvis, female    DOB: 09/21/1950, 70 y.o.   MRN: [id number]  CC: Tiffany Jarvis is a 70 y.o. female who presents today for an acute visit.    HPI: Complains of loose and frequent x2 weeks ago, improving.  Loose stools once today.  Had been 4-5 stools per day. Nonbloody.   She doesn't consider this to be 'loose stools' and abdominal cramping. Abdominal cramping has resolved.   No vomiting, fever, abdominal pain, dysuria.   Appetite is decreased, comes and goes Drinking water.   amoxicillin given by dentist and then started clindamycin  last month.   On 03/25/21 she had covid and flu vaccine  No h/o diverticulitis.  History of hypertension, hyperlipidemia, left breast cancer Performed October 2021, Dr. 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    Colon polyps    Depression    History of chicken pox    Hyperlipidemia    Hypertension    Influenza due to influenza virus, type B 08/25/2018   OSA (obstructive sleep apnea)    not able to tolerate cpap or dental mouth piece    Vertigo    CT neck and head negative significant CAS (1 episode - "yrs ago")   Past Surgical History:  Procedure Laterality Date   BREAST BIOPSY Left 2003   positive   BREAST SURGERY     L mastectomy ~2001/2002 and reconstruction   CATARACT EXTRACTION W/PHACO Left 01/28/2020   Procedure: CATARACT EXTRACTION PHACO AND INTRAOCULAR LENS PLACEMENT ([place]) LEFT;  Surgeon: Leandrew Koyanagi, MD;  Location: [facility] [place];  Service: Ophthalmology;  Laterality: Left;  5.61 0:53.0 10.6%   CATARACT EXTRACTION W/PHACO Right 03/03/2020   Procedure: CATARACT EXTRACTION PHACO AND INTRAOCULAR LENS PLACEMENT ([place]) RIGHT;  Surgeon: Leandrew Koyanagi, MD;  Location: [facility];  Service: Ophthalmology;  Laterality:  Right;  2.52 0:41.6 6.0%   CESAREAN SECTION     x 3 1979, 1982, 1992    COLONOSCOPY     COLONOSCOPY WITH PROPOFOL N/A 04/19/2020   Procedure: COLONOSCOPY WITH BIOPSY;  Surgeon: [name], MD;  Location: [facility];  Service: Endoscopy;  Laterality: N/A;  priority 4   DRUG INDUCED ENDOSCOPY N/A 09/22/2020   Procedure: DRUG INDUCED ENDOSCOPY;  Surgeon: Melida Quitter, MD;  Location: [place];  Service: ENT;  Laterality: N/A;   EYE SURGERY Left 07/2019   retina hole surgery   IMPLANTATION OF HYPOGLOSSAL NERVE STIMULATOR Right 12/15/2020   Procedure: IMPLANTATION OF HYPOGLOSSAL NERVE STIMULATOR;  Surgeon: Melida Quitter, MD;  Location: [facility];  Service: ENT;  Laterality: Right;   MASTECTOMY Left 2003   OVARIAN CYST REMOVAL Right    POLYPECTOMY N/A 04/19/2020   Procedure: POLYPECTOMY;  Surgeon: [name], MD;  Location: [facility];  Service: Endoscopy;  Laterality: N/A;   REDUCTION MAMMAPLASTY Right    TONSILLECTOMY     TRIGGER FINGER RELEASE     Family History  Problem Relation Age of Onset   Cancer Sister        breast cancer    Breast cancer Sister    Cancer Cousin        m 1st cousin breast and pancreatic ca   Breast cancer Cousin    Cancer Cousin  breast m 1st cousin   Breast cancer Cousin    Cancer Cousin        m 1st cousin breast    Breast cancer Cousin    Cancer Cousin        m 1st cousin breast    Breast cancer Cousin    Cancer Mother        colon cancer    Allergies: Patient has no known allergies. Current Outpatient Medications on File Prior to Visit  Medication Sig Dispense Refill   amLODipine (NORVASC) 2.5 MG tablet TAKE 1 TABLET BY MOUTH  DAILY 90 tablet 2   Calcium Carbonate (CALCIUM 600 PO) Take 1 tablet by mouth daily.     lisinopril-hydrochlorothiazide (ZESTORETIC) 20-12.5 MG tablet TAKE 2 TABLETS BY MOUTH  DAILY IN THE MORNING 180 tablet 3   Misc Natural Products (JOINT HEALTH PO) Take by mouth.      Multiple Vitamin (MULTI-VITAMIN DAILY PO) Multi Vitamin     Multiple Vitamins-Minerals (VITAMIN D3 COMPLETE PO) Take 2,000 Units by mouth daily.     Omega-3 Fatty Acids (FISH OIL PO) Take by mouth.     pravastatin (PRAVACHOL) 40 MG tablet TAKE 1 TABLET BY MOUTH AT  BEDTIME 90 tablet 2   venlafaxine XR (EFFEXOR-XR) 150 MG 24 hr capsule TAKE 1 CAPSULE DAILY WITH BREAKFAST 90 capsule 3   HYDROcodone-acetaminophen (NORCO/VICODIN) 5-325 MG tablet Take 1-2 tablets by mouth every 6 (six) hours as needed for moderate pain. 12 tablet 0   polycarbophil (FIBERCON) 625 MG tablet Take 625 mg by mouth daily.     No current facility-administered medications on file prior to visit.    Social History   Tobacco Use   Smoking status: Former    Packs/day: 0.25    Years: 3.00    Pack years: 0.75    Types: Cigarettes    Quit date: 1970    Years since quitting: 52.7   Smokeless tobacco: Never   Tobacco comments:    former smoker x 3 years 19-22 light   Vaping Use   Vaping Use: Never used  Substance Use Topics   Alcohol use: Yes    Alcohol/week: 1.0 standard drink    Types: 1 Glasses of wine per week   Drug use: Never    Review of Systems  Constitutional:  Negative for chills and fever.  Respiratory:  Negative for cough.   Cardiovascular:  Negative for chest pain and palpitations.  Gastrointestinal:  Negative for abdominal distention, abdominal pain, constipation, diarrhea, nausea and vomiting.  Genitourinary:  Negative for dysuria.     Objective:    BP 120/86 (BP Location: Left Arm, Patient Position: Sitting)   Pulse 97   Temp (!) 95.8 F (35.4 C)   Ht 5' 3.5" (1.613 m)   Wt 192 lb 12.8 oz (87.5 kg)   SpO2 96%   BMI 33.61 kg/m    Physical Exam Vitals reviewed.  Constitutional:      Appearance: She is well-developed.  Eyes:     Conjunctiva/sclera: Conjunctivae normal.  Cardiovascular:     Rate and Rhythm: Normal rate and regular rhythm.     Pulses: Normal pulses.     Heart  sounds: Normal heart sounds.  Pulmonary:     Effort: Pulmonary effort is normal.     Breath sounds: Normal breath sounds. No wheezing, rhonchi or rales.  Abdominal:     General: Bowel sounds are normal. There is no distension.     Palpations: Abdomen is soft.  There is no mass.     Tenderness: There is abdominal tenderness in the left lower quadrant. There is no right CVA tenderness, left CVA tenderness, guarding or rebound.  Skin:    General: Skin is warm and dry.  Neurological:     Mental Status: She is alert.  Psychiatric:        Speech: Speech normal.        Behavior: Behavior normal.        Thought Content: Thought content normal.       Assessment & Plan:   Problem List Items Addressed This Visit       Other   Loose stools - Primary    Nonbloody loose stools as opposed to diarrhea.  She was prescribed amoxicillin, clindamycin last month. one episode today. Appears to be improving.  Patient is also nontoxic in appearance afebrile.  However on exam,  focal pain left lower quadrant.  With history of diverticula seen on colonoscopy, advised patient CT abdomen and pelvis to evaluate for diverticulitis.  Pending cdiff test.       Relevant Orders   CT ABDOMEN PELVIS W CONTRAST   Other Visit Diagnoses     Diarrhea, unspecified type       Left lower quadrant abdominal pain       Relevant Orders   CT ABDOMEN PELVIS W CONTRAST         I am having [name] maintain her Multiple Vitamin (MULTI-VITAMIN DAILY PO), Omega-3 Fatty Acids (FISH OIL PO), Multiple Vitamins-Minerals (VITAMIN D3 COMPLETE PO), Calcium Carbonate (CALCIUM 600 PO), Misc Natural Products (JOINT HEALTH PO), venlafaxine XR, polycarbophil, HYDROcodone-acetaminophen, pravastatin, amLODipine, and lisinopril-hydrochlorothiazide.   No orders of the defined types were placed in this encounter.   Return precautions given.   Risks, benefits, and alternatives of the medications and treatment plan prescribed today  were discussed, and patient expressed understanding.   Education regarding symptom management and diagnosis given to patient on AVS.  Continue to follow with Crecencio Mc, MD for routine health maintenance.   [name] and I agreed with plan.   Mable Paris, FNP

## 2021-04-07 ENCOUNTER — Ambulatory Visit
Admission: RE | Admit: 2021-04-07 | Discharge: 2021-04-07 | Disposition: A | Discharge status: Home / Self Care | Payer: Medicare Other | Source: Ambulatory Visit | Attending: Radiation Oncology | Admitting: Radiation Oncology

## 2021-04-07 ENCOUNTER — Encounter: Payer: Self-pay | Admitting: Radiation Oncology

## 2021-04-07 ENCOUNTER — Encounter: Payer: Self-pay | Admitting: Family

## 2021-04-07 DIAGNOSIS — Z86000 Personal history of in-situ neoplasm of breast: Secondary | ICD-10-CM

## 2021-04-07 DIAGNOSIS — R109 Unspecified abdominal pain: Secondary | ICD-10-CM | POA: Insufficient documentation

## 2021-04-07 DIAGNOSIS — R197 Diarrhea, unspecified: Secondary | ICD-10-CM | POA: Diagnosis not present

## 2021-04-07 DIAGNOSIS — K7581 Nonalcoholic steatohepatitis (NASH): Secondary | ICD-10-CM | POA: Insufficient documentation

## 2021-04-07 DIAGNOSIS — K760 Fatty (change of) liver, not elsewhere classified: Secondary | ICD-10-CM | POA: Insufficient documentation

## 2021-04-07 DIAGNOSIS — Z9012 Acquired absence of left breast and nipple: Secondary | ICD-10-CM | POA: Insufficient documentation

## 2021-04-07 NOTE — Progress Notes (Signed)
Radiation Oncology Follow up Note  Name: Tiffany Jarvis   Date:   04/07/2021 MRN:  [id number] DOB: [birth year]    This 70 y.o. 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  I I am following the patient she has never received treatment.  REFERRING PROVIDER: Crecencio Mc, MD  HPI: Patient is a 70 year old female now out 2 years after establishing care in my department status post mastectomy 2003 on the left side for ductal carcinoma in situ.  Seen today without complaint.  Specifically denies any breast tenderness cough or bone pain..  She had a unilateral right mammogram yesterday which was fine BI-RADS 1 negative.  She also had a CT scan yesterday of the abdomen pelvis for abdominal pain and diarrhea for for past 4 days.  Diverticulosis without diverticulitis was seen.  COMPLICATIONS OF TREATMENT: none  FOLLOW UP COMPLIANCE: keeps appointments   PHYSICAL EXAM:  BP (P) 126/89 (BP Location: Left Arm, Patient Position: Sitting)   Pulse (P) 72   Temp (!) (P) 97.1 F (36.2 C) (Tympanic)   Resp (P) 18   Wt (P) 191 lb 9.6 oz (86.9 kg)   BMI (P) 33.40 kg/m  Patient is status post left mastectomy.  Right breast is free of dominant mass or nodularity.  Well-developed well-nourished patient in NAD. HEENT reveals PERLA, EOMI, discs not visualized.  Oral cavity is clear. No oral mucosal lesions are identified. Neck is clear without evidence of cervical or supraclavicular adenopathy. Lungs are clear to A&P. Cardiac examination is essentially unremarkable with regular rate and rhythm without murmur rub or thrill. Abdomen is benign with no organomegaly or masses noted. Motor sensory and DTR levels are equal and symmetric in the upper and lower extremities. Cranial nerves II through XII are grossly intact. Proprioception is intact. No peripheral adenopathy or edema is identified. No motor or sensory levels are noted. Crude visual fields  are within normal range.  RADIOLOGY RESULTS: CT scan and mammogram reviewed compatible with above-stated findings  PLAN: Present time patient is doing well continues with no evidence of disease.  Of asked to see her back in a whole year for follow-up.  Patient knows to call with any concerns at any time.  I would like to take this opportunity to thank you for allowing me to participate in the care of your patient.Noreene Filbert, MD

## 2021-04-08 ENCOUNTER — Other Ambulatory Visit: Payer: Self-pay | Admitting: Family

## 2021-04-08 NOTE — Telephone Encounter (Signed)
Tiffany Jarvis,   Can you follow up on result on cdiff lab? It is taking 3 days Please call pt and let her we are following up and see how she is feeling as well. Fever, abdominal pain,failure to stay hydrated,  or worsening diarrhea would warrant ED evaluation this weekend  I am driving this afternoon so if positive, this will need to be sent to doc of day to send in vancomycin  TT, I believe [facility] switched doc of day with you? Totay 04/08/21?   Do you mind keeping an eye out?   I am once again not going to use our lab for Cdiff and ask pt to always go to [facility] as this is far too long.

## 2021-04-08 NOTE — Telephone Encounter (Signed)
Called and spoke with the patient. Labs were given to our office and sent to [facility]. Was informed that [facility] is behind on their labs and is experiencing a delay at this time. Asked Tiffany Jarvis for assistance and she stated that she will call [facility] to check on the order.

## 2021-04-08 NOTE — Telephone Encounter (Signed)
Spoke with [facility] & there is a delay in most of their test results. However, this test is expected to result later today.

## 2021-04-09 LAB — CLOSTRIDIUM DIFFICILE BY PCR: Toxigenic C. Difficile by PCR: POSITIVE — AB

## 2021-04-11 NOTE — Telephone Encounter (Signed)
Pt called back & reviewed message with her again. She will be going to ED when her husband returns home.

## 2021-04-11 NOTE — Telephone Encounter (Signed)
LMTCB to see how patient is doing & tolerating antibiotic.

## 2021-04-12 NOTE — Telephone Encounter (Signed)
Call pt  Was she in ED?  How is weakness? Is she still having diarrhea?

## 2021-08-26 ENCOUNTER — Telehealth: Payer: Self-pay | Admitting: Internal Medicine

## 2021-08-26 NOTE — Telephone Encounter (Signed)
Patient called in regards to fall she had two weeks ago. Patient states she never went to be evaluated however patient is still feeling pain in her left hip. Patient is wanting an xray however she lives in [place] and would like to know if there is any way the order could be sent to another facility closer to her. Preferably one that is open on saturdays as well.

## 2021-08-28 ENCOUNTER — Emergency Department (INDEPENDENT_AMBULATORY_CARE_PROVIDER_SITE_OTHER): Payer: Medicare Other

## 2021-08-28 ENCOUNTER — Other Ambulatory Visit: Payer: Self-pay

## 2021-08-28 ENCOUNTER — Emergency Department
Admission: EM | Admit: 2021-08-28 | Discharge: 2021-08-28 | Disposition: A | Discharge status: Home / Self Care | Payer: Medicare Other | Source: Home / Self Care

## 2021-08-28 DIAGNOSIS — M25552 Pain in left hip: Secondary | ICD-10-CM

## 2021-08-28 DIAGNOSIS — W19XXXA Unspecified fall, initial encounter: Secondary | ICD-10-CM

## 2021-08-28 DIAGNOSIS — M25512 Pain in left shoulder: Secondary | ICD-10-CM

## 2021-08-28 MED ORDER — CELECOXIB 100 MG PO CAPS: 100.0000 mg | ORAL_CAPSULE | Freq: Two times a day (BID) | ORAL | 0 refills | Status: DC

## 2021-08-28 NOTE — ED Provider Notes (Signed)
[facility]    CSN: [id number] Arrival date & time: 08/28/21  1131      History   Chief Complaint Chief Complaint  Patient presents with   Hip Pain    Left hip pain x2 weeks   Shoulder Pain    Left shoulder pain x2 weeks    HPI Tiffany Jarvis is a 71 y.o. female.   HPI Pleasant 71 year old female presents with left hip pain and left shoulder pain secondary to fall 2 weeks ago.  PMH significant for breast cancer and HTN.  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    Colon polyps    Depression    History of chicken pox    Hyperlipidemia    Hypertension    Influenza due to influenza virus, type B 08/25/2018   OSA (obstructive sleep apnea)    not able to tolerate cpap or dental mouth piece    Vertigo    CT neck and head negative significant CAS (1 episode - "yrs ago")    Patient Active Problem List   Diagnosis Date Noted   Fatty infiltration of liver 04/07/2021   Loose stools 04/06/2021   Personal history of colonic polyps    Polyp of transverse colon    Bruxism, 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   Encounter for preventive health examination 03/09/2019   Anxiety 07/31/2018   Obesity (BMI 30-39.9) 05/29/2018   History of ductal carcinoma in situ (DCIS) of breast 05/28/2018   Prediabetes 05/28/2018   HTN (hypertension) 05/28/2018   HLD (hyperlipidemia) 05/28/2018   Intrinsic eczema 02/25/2018   Anxiety and depression 09/17/2017   Adenomatous colon polyp 11/09/2015   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   Family history of breast cancer 11/19/2014   Sleep apnea 03/01/2014   Dizziness 03/01/2014   Abnormal glucose 03/01/2014   Allergic rhinitis 03/01/2014   Benign neoplasm of colon 03/01/2014   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   Insomnia 03/01/2014   Migraine 03/01/2014   Pure hypercholesterolemia 01/31/2012    Past Surgical History:  Procedure Laterality Date    BREAST BIOPSY Left 2003   positive   BREAST SURGERY     L mastectomy ~2001/2002 and reconstruction   CATARACT EXTRACTION W/PHACO Left 01/28/2020   Procedure: CATARACT EXTRACTION PHACO AND INTRAOCULAR LENS PLACEMENT ([place]) LEFT;  Surgeon: Leandrew Koyanagi, MD;  Location: [place];  Service: Ophthalmology;  Laterality: Left;  5.61 0:53.0 10.6%   CATARACT EXTRACTION W/PHACO Right 03/03/2020   Procedure: CATARACT EXTRACTION PHACO AND INTRAOCULAR LENS PLACEMENT ([place]) RIGHT;  Surgeon: Leandrew Koyanagi, MD;  Location: [place];  Service: Ophthalmology;  Laterality: Right;  2.52 0:41.6 6.0%   CESAREAN SECTION     x 3 1979, 1982, 1992    COLONOSCOPY     COLONOSCOPY WITH PROPOFOL N/A 04/19/2020   Procedure: COLONOSCOPY WITH BIOPSY;  Surgeon: [name], MD;  Location: [place];  Service: Endoscopy;  Laterality: N/A;  priority 4   DRUG INDUCED ENDOSCOPY N/A 09/22/2020   Procedure: DRUG INDUCED ENDOSCOPY;  Surgeon: Melida Quitter, MD;  Location: [place];  Service: ENT;  Laterality: N/A;   EYE SURGERY Left 07/2019   retina hole surgery   IMPLANTATION OF HYPOGLOSSAL NERVE STIMULATOR Right 12/15/2020   Procedure: IMPLANTATION OF HYPOGLOSSAL NERVE STIMULATOR;  Surgeon: Melida Quitter, MD;  Location: [place];  Service: ENT;  Laterality: Right;  MASTECTOMY Left 2003   OVARIAN CYST REMOVAL Right    POLYPECTOMY N/A 04/19/2020   Procedure: POLYPECTOMY;  Surgeon: [name], MD;  Location: [facility];  Service: Endoscopy;  Laterality: N/A;   REDUCTION MAMMAPLASTY Right    TONSILLECTOMY     TRIGGER FINGER RELEASE      OB History   No obstetric history on file.      Home Medications    Prior to Admission medications   Medication Sig Start Date End Date Taking? Authorizing Provider  amLODipine (NORVASC) 2.5 MG tablet TAKE 1 TABLET BY MOUTH  DAILY 02/28/21  Yes Crecencio Mc, MD  Calcium Carbonate (CALCIUM 600 PO) Take  1 tablet by mouth daily.   Yes [provider]  celecoxib (CELEBREX) 100 MG capsule Take 1 capsule (100 mg total) by mouth 2 (two) times daily for 15 days. 08/28/21 09/12/21 Yes Eliezer Lofts, FNP  lisinopril-hydrochlorothiazide (ZESTORETIC) 20-12.5 MG tablet TAKE 2 TABLETS BY MOUTH  DAILY IN THE MORNING 03/01/21  Yes Crecencio Mc, MD  Misc Natural Products ([place]) Take by mouth.   Yes [provider]  Multiple Vitamin (MULTI-VITAMIN DAILY PO) Multi Vitamin   Yes [provider]  Multiple Vitamins-Minerals (VITAMIN D3 COMPLETE PO) Take 2,000 Units by mouth daily.   Yes [provider]  Omega-3 Fatty Acids (FISH OIL PO) Take by mouth.   Yes [provider]  pravastatin (PRAVACHOL) 40 MG tablet TAKE 1 TABLET BY MOUTH AT  BEDTIME 02/28/21  Yes Crecencio Mc, MD  venlafaxine XR (EFFEXOR-XR) 150 MG 24 hr capsule TAKE 1 CAPSULE DAILY WITH BREAKFAST 11/01/20  Yes Crecencio Mc, MD    Family History Family History  Problem Relation Age of Onset   Cancer Sister        breast cancer    Breast cancer Sister    Cancer Cousin        m 1st cousin breast and pancreatic ca   Breast cancer Cousin    Cancer Cousin        breast m 1st cousin   Breast cancer Cousin    Cancer Cousin        m 1st cousin breast    Breast cancer Cousin    Cancer Cousin        m 1st cousin breast    Breast cancer Cousin    Cancer Mother        colon cancer    Social History Social History   Tobacco Use   Smoking status: Former    Packs/day: 0.25    Years: 3.00    Pack years: 0.75    Types: Cigarettes    Quit date: 1970    Years since quitting: 53.1   Smokeless tobacco: Never   Tobacco comments:    former smoker x 3 years 19-22 light   Vaping Use   Vaping Use: Never used  Substance Use Topics   Alcohol use: Yes    Alcohol/week: 1.0 standard drink    Types: 1 Glasses of wine per week    Comment: occ   Drug use: Never     Allergies   Patient has  no known allergies.   Review of Systems Review of Systems  Musculoskeletal:        Left hip pain and left shoulder pain x 2 weeks    Physical Exam Triage Vital Signs ED Triage Vitals  Enc Vitals Group     BP 08/28/21 1238 Marland Kitchen)  153/90     Pulse Rate 08/28/21 1238 74     Resp 08/28/21 1238 18     Temp 08/28/21 1238 98.6 F (37 C)     Temp Source 08/28/21 1238 Oral     SpO2 08/28/21 1238 99 %     Weight 08/28/21 1236 186 lb (84.4 kg)     Height 08/28/21 1236 5' 3.5" (1.613 m)     Head Circumference --      Peak Flow --      Pain Score 08/28/21 1236 6     Pain Loc --      Pain Edu? --      Excl. in [place]? --    No data found.  Updated Vital Signs BP (!) 153/90 (BP Location: Right Arm)    Pulse 74    Temp 98.6 F (37 C) (Oral)    Resp 18    Ht 5' 3.5" (1.613 m)    Wt 186 lb (84.4 kg)    SpO2 99%    BMI 32.43 kg/m       Physical Exam Vitals and nursing note reviewed.  Constitutional:      General: She is not in acute distress.    Appearance: Normal appearance. She is obese. She is not ill-appearing.  HENT:     Head: Normocephalic and atraumatic.     Mouth/Throat:     Mouth: Mucous membranes are moist.     Pharynx: Oropharynx is clear.  Eyes:     Extraocular Movements: Extraocular movements intact.     Conjunctiva/sclera: Conjunctivae normal.     Pupils: Pupils are equal, round, and reactive to light.  Cardiovascular:     Rate and Rhythm: Normal rate and regular rhythm.     Pulses: Normal pulses.     Heart sounds: Normal heart sounds.  Pulmonary:     Effort: Pulmonary effort is normal.     Breath sounds: Normal breath sounds.  Musculoskeletal:     Cervical back: Normal range of motion and neck supple.     Comments: Left shoulder: TTP over [place] joint, L ROM with forward flexion/extension, horizontal abduction  Left hip: TTP over inferior posterior aspect, no deformity noted  Skin:    General: Skin is warm and dry.  Neurological:     General: No focal deficit  present.     Mental Status: She is alert and oriented to person, place, and time.     UC Treatments / Results  Labs (all labs ordered are listed, but only abnormal results are displayed) Labs Reviewed - No data to display  EKG   Radiology DG Shoulder Left  Result Date: 08/28/2021 CLINICAL DATA:  Fall 2 weeks ago.  Left shoulder pain. EXAM: LEFT SHOULDER - 2+ VIEW COMPARISON:  None. FINDINGS: No fracture. No shoulder separation or dislocation. Degenerative spurring noted in the glenohumeral joint. IMPRESSION: Degenerative changes without acute bony abnormality. Electronically Signed   By: Misty Stanley M.D.   On: 08/28/2021 14:16   DG Hip Unilat W or Wo Pelvis 2-3 Views Left  Result Date: 08/28/2021 CLINICAL DATA:  Fall 2 weeks ago.  Left hip pain. EXAM: DG HIP (WITH OR WITHOUT PELVIS) 2-3V LEFT COMPARISON:  None. FINDINGS: No evidence for an acute fracture. SI joints and symphysis pubis unremarkable. Degenerative spurring noted in the acetabulum and left femoral head. No worrisome lytic or sclerotic osseous abnormality. IMPRESSION: Degenerative changes without acute bony abnormality. Electronically Signed   By: Verda Cumins.D.  On: 08/28/2021 14:17    Procedures Procedures (including critical care time)  Medications Ordered in UC Medications - No data to display  Initial Impression / Assessment and Plan / UC Course  I have reviewed the triage vital signs and the nursing notes.  Pertinent labs & imaging results that were available during my care of the patient were reviewed by me and considered in my medical decision making (see chart for details).     MDM: 1.  Left hip pain-left hip x-ray was negative for acute osseous changes, degenerative changes without acute bony normal abnormality; 2.  Left anterior shoulder pain-left shoulder x-ray was negative for acute osseous process, Rx'd Celebrex. Advised/informed patient of left hip and left shoulder x-ray results today prior to  discharge with hard copies provided.  Advised patient to take medication as directed with food to completion.  Encouraged patient to increase daily water intake while taking this medication.  Advised patient if either left hip or left shoulder pain worsens and/or unresolved please follow-up with [facility] orthopedic provider (contact information provided with this AVS) for further evaluation. Final Clinical Impressions(s) / UC Diagnoses   Final diagnoses:  Left hip pain  Left anterior shoulder pain     Discharge Instructions      Advised/informed patient of left hip and left shoulder x-ray results today prior to discharge with hard copies provided.  Advised patient to take medication as directed with food to completion.  Encouraged patient to increase daily water intake while taking this medication.  Advised patient if either left hip or left shoulder pain worsens and/or unresolved please follow-up with [facility] orthopedic provider (contact information provided with this AVS) for further evaluation.     ED Prescriptions     Medication Sig Dispense Auth. Provider   celecoxib (CELEBREX) 100 MG capsule Take 1 capsule (100 mg total) by mouth 2 (two) times daily for 15 days. 30 capsule Eliezer Lofts, FNP      PDMP not reviewed this encounter.   Eliezer Lofts, [place] 08/28/21 1438

## 2021-08-28 NOTE — Discharge Instructions (Addendum)
Advised/informed patient of left hip and left shoulder x-ray results today prior to discharge with hard copies provided.  Advised patient to take medication as directed with food to completion.  Encouraged patient to increase daily water intake while taking this medication.  Advised patient if either left hip or left shoulder pain worsens and/or unresolved please follow-up with [facility] orthopedic provider (contact information provided with this AVS) for further evaluation.

## 2021-08-28 NOTE — ED Triage Notes (Signed)
Pt states that she had a fall x2 weeks ago.  Pt states that she has some left hip and shoulder pain.

## 2021-08-29 NOTE — Telephone Encounter (Signed)
Spoke with pt and she stated that she went to UC over the weekend.

## 2021-09-14 ENCOUNTER — Ambulatory Visit: Payer: Medicare Other | Admitting: Internal Medicine

## 2021-09-23 ENCOUNTER — Ambulatory Visit: Payer: Medicare Other | Admitting: Internal Medicine

## 2021-09-27 ENCOUNTER — Other Ambulatory Visit: Payer: Self-pay | Admitting: Internal Medicine

## 2021-09-27 DIAGNOSIS — R232 Flushing: Secondary | ICD-10-CM

## 2021-09-30 ENCOUNTER — Telehealth: Payer: Self-pay | Admitting: Internal Medicine

## 2021-09-30 NOTE — Telephone Encounter (Signed)
Attempted to schedule Patient annual wellness visit. Patient declined.  ?

## 2021-10-12 ENCOUNTER — Ambulatory Visit (INDEPENDENT_AMBULATORY_CARE_PROVIDER_SITE_OTHER): Payer: Self-pay | Admitting: Internal Medicine

## 2021-10-12 DIAGNOSIS — Z1329 Encounter for screening for other suspected endocrine disorder: Secondary | ICD-10-CM

## 2021-10-12 DIAGNOSIS — I10 Essential (primary) hypertension: Secondary | ICD-10-CM

## 2021-10-12 DIAGNOSIS — E785 Hyperlipidemia, unspecified: Secondary | ICD-10-CM

## 2021-10-12 DIAGNOSIS — R7303 Prediabetes: Secondary | ICD-10-CM

## 2021-10-14 NOTE — Progress Notes (Signed)
Patient failed to keep scheduled appointment and will be charged a no show fee.   

## 2021-11-02 ENCOUNTER — Ambulatory Visit (INDEPENDENT_AMBULATORY_CARE_PROVIDER_SITE_OTHER): Payer: Medicare Other | Admitting: Internal Medicine

## 2021-11-02 ENCOUNTER — Encounter: Payer: Self-pay | Admitting: Internal Medicine

## 2021-11-02 VITALS — BP 138/76 | HR 97 | Temp 98.1°F | Ht 63.5 in | Wt 185.2 lb

## 2021-11-02 DIAGNOSIS — E785 Hyperlipidemia, unspecified: Secondary | ICD-10-CM

## 2021-11-02 DIAGNOSIS — R040 Epistaxis: Secondary | ICD-10-CM | POA: Diagnosis not present

## 2021-11-02 DIAGNOSIS — R7303 Prediabetes: Secondary | ICD-10-CM | POA: Diagnosis not present

## 2021-11-02 DIAGNOSIS — Z0000 Encounter for general adult medical examination without abnormal findings: Secondary | ICD-10-CM | POA: Diagnosis not present

## 2021-11-02 DIAGNOSIS — D0512 Intraductal carcinoma in situ of left breast: Secondary | ICD-10-CM | POA: Diagnosis not present

## 2021-11-02 DIAGNOSIS — K760 Fatty (change of) liver, not elsewhere classified: Secondary | ICD-10-CM | POA: Diagnosis not present

## 2021-11-02 DIAGNOSIS — Z87898 Personal history of other specified conditions: Secondary | ICD-10-CM

## 2021-11-02 DIAGNOSIS — Z1329 Encounter for screening for other suspected endocrine disorder: Secondary | ICD-10-CM

## 2021-11-02 DIAGNOSIS — I10 Essential (primary) hypertension: Secondary | ICD-10-CM

## 2021-11-02 DIAGNOSIS — Z1231 Encounter for screening mammogram for malignant neoplasm of breast: Secondary | ICD-10-CM

## 2021-11-02 DIAGNOSIS — E7800 Pure hypercholesterolemia, unspecified: Secondary | ICD-10-CM

## 2021-11-02 LAB — CBC WITH DIFFERENTIAL/PLATELET
Basophils Absolute: 0.1 10*3/uL (ref 0.0–0.1)
Basophils Relative: 0.8 % (ref 0.0–3.0)
Eosinophils Absolute: 0.1 10*3/uL (ref 0.0–0.7)
Eosinophils Relative: 0.8 % (ref 0.0–5.0)
HCT: 39.8 % (ref 36.0–46.0)
Hemoglobin: 13.9 g/dL (ref 12.0–15.0)
Lymphocytes Relative: 38.4 % (ref 12.0–46.0)
Lymphs Abs: 2.5 10*3/uL (ref 0.7–4.0)
MCHC: 35 g/dL (ref 30.0–36.0)
MCV: 87.1 fl (ref 78.0–100.0)
Monocytes Absolute: 0.4 10*3/uL (ref 0.1–1.0)
Monocytes Relative: 5.8 % (ref 3.0–12.0)
Neutro Abs: 3.5 10*3/uL (ref 1.4–7.7)
Neutrophils Relative %: 54.2 % (ref 43.0–77.0)
Platelets: 224 10*3/uL (ref 150.0–400.0)
RBC: 4.57 Mil/uL (ref 3.87–5.11)
RDW: 12.9 % (ref 11.5–15.5)
WBC: 6.6 10*3/uL (ref 4.0–10.5)

## 2021-11-02 LAB — COMPREHENSIVE METABOLIC PANEL
ALT: 22 U/L (ref 0–35)
AST: 19 U/L (ref 0–37)
Albumin: 4.4 g/dL (ref 3.5–5.2)
Alkaline Phosphatase: 47 U/L (ref 39–117)
BUN: 15 mg/dL (ref 6–23)
CO2: 30 mEq/L (ref 19–32)
Calcium: 9.2 mg/dL (ref 8.4–10.5)
Chloride: 104 mEq/L (ref 96–112)
Creatinine, Ser: 0.61 mg/dL (ref 0.40–1.20)
GFR: 91.9 mL/min (ref >60.00)
Glucose, Bld: 97 mg/dL (ref 70–99)
Potassium: 3.6 mEq/L (ref 3.5–5.1)
Sodium: 142 mEq/L (ref 135–145)
Total Bilirubin: 0.7 mg/dL (ref 0.2–1.2)
Total Protein: 6.7 g/dL (ref 6.0–8.3)

## 2021-11-02 LAB — LIPID PANEL
Cholesterol: 203 mg/dL — ABNORMAL HIGH (ref 0–200)
HDL: 64.6 mg/dL (ref >39.00)
LDL Cholesterol: 101 mg/dL — ABNORMAL HIGH (ref 0–99)
NonHDL: 138.57
Total CHOL/HDL Ratio: 3
Triglycerides: 187 mg/dL — ABNORMAL HIGH (ref 0.0–149.0)
VLDL: 37.4 mg/dL (ref 0.0–40.0)

## 2021-11-02 LAB — TSH: TSH: 1.28 u[IU]/mL (ref 0.35–5.50)

## 2021-11-02 LAB — PROTIME-INR
INR: 1.1 ratio — ABNORMAL HIGH (ref 0.8–1.0)
Prothrombin Time: 12 s (ref 9.6–13.1)

## 2021-11-02 LAB — HEMOGLOBIN A1C: Hgb A1c MFr Bld: 5.9 % (ref 4.6–6.5)

## 2021-11-02 LAB — APTT: aPTT: 32.3 s (ref 23.4–32.7)

## 2021-11-02 MED ORDER — CELECOXIB 100 MG PO CAPS: 100.0000 mg | ORAL_CAPSULE | Freq: Two times a day (BID) | ORAL | 0 refills | Status: DC | PRN

## 2021-11-02 NOTE — Patient Instructions (Addendum)
Use Afrin nasal spray to stop a nosebleed,  pinch nostrils and tilt head back ? ?Use vaseline after flushing with saline nasal spray ([place] ) ? ? ?Your annual mammogram has been ordered.  You are encouraged (required) to call to make your appointment at [facility]  schedule it on sept 17   ? ? ?Not combine celebrex with aleve or motrin .  Ok to combine with tylenol ? ?You can add up to 1000 mg of acetominophen (tylenol) every day safely  In divided doses (500 mg every  12 hours.) safely  ? ?If you develop symptoms of diarrhea or UTI  CALL THE OFFICE AND ASK FOR JESSICA.   We will get you to the lab FIRST TO get specimens , and schedule a visit 48 hours later  ? ? ? ? ?

## 2021-11-02 NOTE — Progress Notes (Signed)
Patient ID: Tiffany Jarvis, female    DOB: 09/02/1950  Age: 71 y.o. MRN: [id number] ? ?The patient is here for annual preventive examination and management of other chronic and acute problems. ? ?Normal BMD 2020 ?H/o Left mastectomy for DCIs,  due for unilateral right ?Colonoscopy in 2021 by  DW   7 yr follow p 2028 ?.  ?  ?The risk factors are reflected in the social history. ? ?The roster of all physicians providing medical care to patient - is listed in the Snapshot section of the chart. ? ?Activities of daily living:  The patient is 100% independent in all ADLs: dressing, toileting, feeding as well as independent mobility ? ?Home safety : The patient has smoke detectors in the home. They wear seatbelts.  There are no firearms at home. There is no violence in the home.  ? ?There is no risks for hepatitis, STDs or HIV. There is no   history of blood transfusion. They have no travel history to infectious disease endemic areas of the world. ? ?The patient has seen their dentist in the last six month. They have seen their eye doctor in the last year. They admit to slight hearing difficulty with regard to whispered voices and some television programs.  They have deferred audiologic testing in the last year.  They do not  have excessive sun exposure. Discussed the need for sun protection: hats, long sleeves and use of sunscreen if there is significant sun exposure.  ? ?Diet: the importance of a healthy diet is discussed. They do have a healthy diet. ? ?The benefits of regular aerobic exercise were discussed. She walks 4 times per week ,  20 minutes.  ? ?Depression screen: there are no signs or vegative symptoms of depression- irritability, change in appetite, anhedonia, sadness/tearfullness. ? ?Cognitive assessment: the patient manages all their financial and personal affairs and is actively engaged. They could relate day,date,year and events; recalled 2/3 objects at 3 minutes; performed clock-face test normally. ? ?The  following portions of the patient's history were reviewed and updated as appropriate: allergies, current medications, past family history, past medical history,  past surgical history, past social history  and problem list. ? ?Visual acuity was not assessed per patient preference since she has regular follow up with her ophthalmologist. Hearing and body mass index were assessed and reviewed.  ? ?During the course of the visit the patient was educated and counseled about appropriate screening and preventive services including : fall prevention , diabetes screening, nutrition counseling, colorectal cancer screening, and recommended immunizations.   ? ?CC: The encounter diagnosis was Encounter for screening mammogram for malignant neoplasm of breast. ? ? ?History of fall in early February ;   left hip was bruised and painful with walking ,  occurred when she tripped over the Development worker, community. Was given celebrex which she preferred over ibuprofen which she takes 1200 mg daily  ? ?Frequent spontaneous nosebleeds ,  improved since using vaseline to moisturize inside of nose.  ? ?Obesity:  she is exercising regulary and following a low GI diet ? ?Hypertension: patient checks blood pressure twice weekly at home.  Readings have been for the most part <140/80 at rest . Patient is following a reduce salt diet most days and is taking medications as prescribed  ? ?History ?Tiffany Jarvis has a past medical history of Allergy, Cancer ([place]), Colon polyps, Depression, History of chicken pox, Hyperlipidemia, Hypertension, Influenza due to influenza virus, type B (08/25/2018), OSA (obstructive sleep apnea), and Vertigo.  ? ?  She has a past surgical history that includes Cesarean section; Breast surgery; Mastectomy (Left, 2003); Breast biopsy (Left, 2003); Reduction mammaplasty (Right); Cataract extraction w/PHACO (Left, 01/28/2020); Trigger finger release; Tonsillectomy; Ovarian cyst removal (Right); Colonoscopy; Eye surgery (Left, 07/2019); Cataract  extraction w/PHACO (Right, 03/03/2020); Colonoscopy with propofol (N/A, 04/19/2020); polypectomy (N/A, 04/19/2020); Drug induced endoscopy (N/A, 09/22/2020); and Implantation of hypoglossal nerve stimulator (Right, 12/15/2020).  ? ?Her family history includes Breast cancer in her cousin, cousin, cousin, cousin, and sister; Cancer in her cousin, cousin, cousin, cousin, mother, and sister.She reports that she quit smoking about 53 years ago. Her smoking use included cigarettes. She has a 0.75 pack-year smoking history. She has never used smokeless tobacco. She reports current alcohol use of about 1.0 standard drink per week. She reports that she does not use drugs. ? ?Outpatient Medications Prior to Visit  ?Medication Sig Dispense Refill  ? amLODipine (NORVASC) 2.5 MG tablet TAKE 1 TABLET BY MOUTH  DAILY 90 tablet 2  ? Calcium Carbonate (CALCIUM 600 PO) Take 1 tablet by mouth daily.    ? lisinopril-hydrochlorothiazide (ZESTORETIC) 20-12.5 MG tablet TAKE 2 TABLETS BY MOUTH  DAILY IN THE MORNING 180 tablet 3  ? Misc Natural Products (JOINT HEALTH PO) Take by mouth.    ? Multiple Vitamin (MULTI-VITAMIN DAILY PO) Multi Vitamin    ? Multiple Vitamins-Minerals (VITAMIN D3 COMPLETE PO) Take 2,000 Units by mouth daily.    ? Omega-3 Fatty Acids (FISH OIL PO) Take by mouth.    ? pravastatin (PRAVACHOL) 40 MG tablet TAKE 1 TABLET BY MOUTH AT  BEDTIME 90 tablet 2  ? venlafaxine XR (EFFEXOR-XR) 150 MG 24 hr capsule TAKE 1 CAPSULE BY MOUTH  DAILY WITH BREAKFAST 90 capsule 3  ? ?No facility-administered medications prior to visit.  ? ? ?Review of Systems ? ?Patient denies headache, fevers, malaise, unintentional weight loss, skin rash, eye pain, sinus congestion and sinus pain, sore throat, dysphagia,  hemoptysis , cough, dyspnea, wheezing, chest pain, palpitations, orthopnea, edema, abdominal pain, nausea, melena, diarrhea, constipation, flank pain, dysuria, hematuria, urinary  Frequency, nocturia, numbness, tingling, seizures,  Focal  weakness, Loss of consciousness,  Tremor, insomnia, depression, anxiety, and suicidal ideation.   ? ? ?Objective:  ?BP 138/76 (BP Location: Left Arm, Patient Position: Sitting, Cuff Size: Large)   Pulse 97   Temp 98.1 ?F (36.7 ?C) (Oral)   Ht 5' 3.5" (1.613 m)   Wt 185 lb 3.2 oz (84 kg)   SpO2 95%   BMI 32.29 kg/m?  ? ?Physical Exam ?. ?General appearance: alert, cooperative and appears stated age ?Ears: normal TM's and external ear canals both ears ?Throat: lips, mucosa, and tongue normal; teeth and gums normal ?Neck: no adenopathy, no carotid bruit, supple, symmetrical, trachea midline and thyroid not enlarged, symmetric, no tenderness/mass/nodules ?Back: symmetric, no curvature. ROM normal. No CVA tenderness. ?Lungs: clear to auscultation bilaterally ?Heart: regular rate and rhythm, S1, S2 normal, no murmur, click, rub or gallop ?Abdomen: soft, non-tender; bowel sounds normal; no masses,  no organomegaly ?Pulses: 2+ and symmetric ?Skin: Skin color, texture, turgor normal. No rashes or lesions ?Lymph nodes: Cervical, supraclavicular, and axillary nodes normal.  ? ?Assessment & Plan:  ? ?Problem List Items Addressed This Visit   ?None ?Visit Diagnoses   ? ? Encounter for screening mammogram for malignant neoplasm of breast    -  Primary  ? Relevant Orders  ? MM 3D SCREEN BREAST BILATERAL  ? ?  ? ? ?I am having Sophia Sperry maintain her  Multiple Vitamin (MULTI-VITAMIN DAILY PO), Omega-3 Fatty Acids (FISH OIL PO), Multiple Vitamins-Minerals (VITAMIN D3 COMPLETE PO), Calcium Carbonate (CALCIUM 600 PO), Misc Natural Products (JOINT HEALTH PO), pravastatin, amLODipine, lisinopril-hydrochlorothiazide, and venlafaxine XR. ? ?No orders of the defined types were placed in this encounter. ? ? ?There are no discontinued medications. ? ?Follow-up: No follow-ups on file. ? ? ?Crecencio Mc, MD ? ?

## 2021-11-05 DIAGNOSIS — Z87898 Personal history of other specified conditions: Secondary | ICD-10-CM | POA: Insufficient documentation

## 2021-11-05 NOTE — Assessment & Plan Note (Signed)
Reviewed sinus hygiene, management of allergic rhinitis and bleeding episodes.  ?

## 2021-11-05 NOTE — Assessment & Plan Note (Signed)
Annual screening mammograms of right breast are done every September by Dr Noreene Filbert  ?

## 2021-11-05 NOTE — Assessment & Plan Note (Signed)

## 2021-11-05 NOTE — Assessment & Plan Note (Signed)
Managed with pravastatin. ? ?Lab Results  ?Component Value Date  ? CHOL 203 (H) 11/02/2021  ? HDL 64.60 11/02/2021  ? LDLCALC 101 (H) 11/02/2021  ? TRIG 187.0 (H) 11/02/2021  ? CHOLHDL 3 11/02/2021  ? ? ? ?Lab Results  ?Component Value Date  ? ALT 22 11/02/2021  ? AST 19 11/02/2021  ? ALKPHOS 47 11/02/2021  ? BILITOT 0.7 11/02/2021  ? ? ? ?

## 2021-11-05 NOTE — Assessment & Plan Note (Addendum)
Described on Sept 2022 CT scan.  She has metabolic syndrom of obesity and prediabets and and has addressed all with diet and exercise.  She has lost 12 lbs since June 2022  ? ?Lab Results  ?Component Value Date  ? HGBA1C 5.9 11/02/2021  ? ?Lab Results  ?Component Value Date  ? ALT 22 11/02/2021  ? AST 19 11/02/2021  ? ALKPHOS 47 11/02/2021  ? BILITOT 0.7 11/02/2021  ? ?Lab Results  ?Component Value Date  ? CHOL 203 (H) 11/02/2021  ? HDL 64.60 11/02/2021  ? LDLCALC 101 (H) 11/02/2021  ? TRIG 187.0 (H) 11/02/2021  ? CHOLHDL 3 11/02/2021  ? ? ?

## 2021-11-05 NOTE — Assessment & Plan Note (Signed)
Well controlled on current regimen of amlodipine 2.5 mg  Lisinopril/hctz.  . Renal function stable, no changes today. ? ?Lab Results  ?Component Value Date  ? CREATININE 0.61 11/02/2021  ? ?Lab Results  ?Component Value Date  ? NA 142 11/02/2021  ? K 3.6 11/02/2021  ? CL 104 11/02/2021  ? CO2 30 11/02/2021  ? ? ?

## 2021-11-09 ENCOUNTER — Other Ambulatory Visit: Payer: Self-pay | Admitting: Internal Medicine

## 2021-11-09 DIAGNOSIS — E785 Hyperlipidemia, unspecified: Secondary | ICD-10-CM

## 2021-11-10 ENCOUNTER — Other Ambulatory Visit: Payer: Self-pay | Admitting: Internal Medicine

## 2021-11-10 DIAGNOSIS — I10 Essential (primary) hypertension: Secondary | ICD-10-CM

## 2021-11-15 ENCOUNTER — Telehealth: Payer: Self-pay | Admitting: Internal Medicine

## 2021-11-15 NOTE — Telephone Encounter (Signed)
Copied from [place] [id number]. Topic: Medicare AWV ?>> Nov 15, 2021 11:15 AM Harris-Coley, Hannah Beat wrote: ?Reason for CRM: Left message for patient to schedule Annual Wellness Visit.  Please schedule with Nurse Health Advisor Denisa O'Brien-Blaney, LPN at [facility].  Please call [phone] ask for Juliann Pulse ?

## 2021-11-29 ENCOUNTER — Telehealth: Payer: Self-pay | Admitting: Internal Medicine

## 2021-12-26 ENCOUNTER — Telehealth: Payer: Self-pay | Admitting: Internal Medicine

## 2021-12-26 NOTE — Telephone Encounter (Signed)
Spoke with patient she declined AWV do not call  

## 2021-12-28 MED ORDER — CELECOXIB 100 MG PO CAPS: 100.0000 mg | ORAL_CAPSULE | Freq: Two times a day (BID) | ORAL | 0 refills | Status: DC | PRN

## 2021-12-28 NOTE — Addendum Note (Signed)
Addended by: Adair Laundry on: 12/28/2021 03:10 PM   Modules accepted: Orders

## 2021-12-28 NOTE — Telephone Encounter (Signed)
Pt need a refill on celecoxib sent through [facility].

## 2021-12-28 NOTE — Telephone Encounter (Signed)
Medication has been refilled.

## 2022-01-08 ENCOUNTER — Other Ambulatory Visit: Payer: Self-pay | Admitting: Internal Medicine

## 2022-01-20 ENCOUNTER — Other Ambulatory Visit: Payer: Self-pay

## 2022-01-20 ENCOUNTER — Telehealth: Payer: Self-pay | Admitting: Internal Medicine

## 2022-01-20 DIAGNOSIS — I10 Essential (primary) hypertension: Secondary | ICD-10-CM

## 2022-01-20 DIAGNOSIS — R232 Flushing: Secondary | ICD-10-CM

## 2022-01-20 DIAGNOSIS — E785 Hyperlipidemia, unspecified: Secondary | ICD-10-CM

## 2022-01-20 MED ORDER — VENLAFAXINE HCL ER 150 MG PO CP24: ORAL_CAPSULE | ORAL | 0 refills | Status: DC

## 2022-01-20 MED ORDER — PRAVASTATIN SODIUM 40 MG PO TABS: 40.0000 mg | ORAL_TABLET | Freq: Every day | ORAL | 0 refills | Status: DC

## 2022-01-20 MED ORDER — LISINOPRIL-HYDROCHLOROTHIAZIDE 20-12.5 MG PO TABS: ORAL_TABLET | ORAL | 0 refills | Status: DC

## 2022-01-20 MED ORDER — AMLODIPINE BESYLATE 2.5 MG PO TABS: 2.5000 mg | ORAL_TABLET | Freq: Every day | ORAL | 0 refills | Status: DC

## 2022-01-20 NOTE — Telephone Encounter (Signed)
I called patient & let her know that I did send in a [facility] [place]. I advised that she could let us know if any issues & I did not know about costs.

## 2022-01-20 NOTE — Telephone Encounter (Signed)
Pt called in requesting refill on medications (venlafaxine XR (EFFEXOR-XR) 150 MG 24 hr capsule), (pravastatin (PRAVACHOL) 40 MG tablet), (amLODipine (NORVASC) 2.5 MG tablet), and (lisinopril-hydrochlorothiazide (ZESTORETIC) 20-12.5 MG tablet).... Pt [place] that [place]... Pt stated that her daughter is having some issues right now where pt can't return back home just yet.... Pt stated that she have enough medication for a few day but not enough to last her until the end of her stay... Pt was wondering if Dr. Derrel Nip can refill medication and [facility] [place] [place].... [facility] phone [facility] is [phone]

## 2022-02-06 ENCOUNTER — Telehealth: Payer: Self-pay | Admitting: Internal Medicine

## 2022-02-06 NOTE — Telephone Encounter (Signed)
Spoke with pt and she stated that she would like to increase her Celebrex due to it not controlling her pain. Pt is currently taking Celebrex 100 mg BID.

## 2022-02-06 NOTE — Telephone Encounter (Signed)
Pt called wanting to increase the amount of celecoxib that she takes because the amount she is taking is not working. Pt takes tylenol when celecoxib does not work

## 2022-02-08 NOTE — Telephone Encounter (Signed)
Spoke with pt and she stated that on her worst days she is already taking the Celebrex 100 mg BID and the Tylenol 1000 mg BID. Pt wanted to know if there was anything stronger that she could use on her bad days. Advised pt that she would need to schedule an appt to discuss any stronger pain medication. Pt stated that she will continue with the regimen she is currently doing see how it goes and if she needs an appt she will call back.

## 2022-02-11 ENCOUNTER — Other Ambulatory Visit: Payer: Self-pay | Admitting: Internal Medicine

## 2022-02-11 DIAGNOSIS — I10 Essential (primary) hypertension: Secondary | ICD-10-CM

## 2022-02-11 DIAGNOSIS — R232 Flushing: Secondary | ICD-10-CM

## 2022-02-12 ENCOUNTER — Other Ambulatory Visit: Payer: Self-pay | Admitting: Internal Medicine

## 2022-02-12 DIAGNOSIS — E785 Hyperlipidemia, unspecified: Secondary | ICD-10-CM

## 2022-02-16 ENCOUNTER — Ambulatory Visit: Payer: Self-pay

## 2022-02-16 ENCOUNTER — Ambulatory Visit: Payer: Medicare Other | Admitting: Family Medicine

## 2022-02-16 ENCOUNTER — Encounter: Payer: Self-pay | Admitting: Family Medicine

## 2022-02-16 VITALS — BP 144/87 | Ht 63.5 in | Wt 185.0 lb

## 2022-02-16 DIAGNOSIS — M25552 Pain in left hip: Secondary | ICD-10-CM

## 2022-02-16 DIAGNOSIS — M180 Bilateral primary osteoarthritis of first carpometacarpal joints: Secondary | ICD-10-CM

## 2022-02-16 DIAGNOSIS — T148XXA Other injury of unspecified body region, initial encounter: Secondary | ICD-10-CM | POA: Diagnosis not present

## 2022-02-16 DIAGNOSIS — M25522 Pain in left elbow: Secondary | ICD-10-CM

## 2022-02-16 HISTORY — DX: Other injury of unspecified body region, initial encounter: T14.8XXA

## 2022-02-16 MED ORDER — PREDNISONE 5 MG PO TABS: ORAL_TABLET | ORAL | 0 refills | Status: DC

## 2022-02-16 NOTE — Progress Notes (Signed)
Tiffany Jarvis - 71 y.o. female MRN [id number]  Date of birth: 16-Jan-1951  SUBJECTIVE:  Including CC & ROS.  No chief complaint on file.   Tiffany Jarvis is a 71 y.o. female that is presenting with left hip pain, left elbow pain and hand pain.  She had a fall a few months ago and had lateral hip pain.  She had another recent fall now having lateral hip pain.  Localized to the lateral aspect.  Having posterior olecranon pain since the fall as well.  Has acute on chronic bilateral hand pain..  Reviewed the urgent care note from 2/12 shows she was provided Celebrex. Independent review of the left hip x-ray from 2/12 shows mild degenerative joint changes and spurring over the lateral trochanter.  Review of Systems See HPI   HISTORY: Past Medical, Surgical, Social, and Family History Reviewed & Updated per EMR.   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    Colon polyps    Depression    History of chicken pox    Hyperlipidemia    Hypertension    Influenza due to influenza virus, type B 08/25/2018   OSA (obstructive sleep apnea)    not able to tolerate cpap or dental mouth piece    Vertigo    CT neck and head negative significant CAS (1 episode - "yrs ago")    Past Surgical History:  Procedure Laterality Date   BREAST BIOPSY Left 2003   positive   BREAST SURGERY     L mastectomy ~2001/2002 and reconstruction   CATARACT EXTRACTION W/PHACO Left 01/28/2020   Procedure: CATARACT EXTRACTION PHACO AND INTRAOCULAR LENS PLACEMENT ([place]) LEFT;  Surgeon: Leandrew Koyanagi, MD;  Location: [place];  Service: Ophthalmology;  Laterality: Left;  5.61 0:53.0 10.6%   CATARACT EXTRACTION W/PHACO Right 03/03/2020   Procedure: CATARACT EXTRACTION PHACO AND INTRAOCULAR LENS PLACEMENT ([place]) RIGHT;  Surgeon: Leandrew Koyanagi, MD;  Location: [place];  Service: Ophthalmology;   Laterality: Right;  2.52 0:41.6 6.0%   CESAREAN SECTION     x 3 1979, 1982, 1992    COLONOSCOPY     COLONOSCOPY WITH PROPOFOL N/A 04/19/2020   Procedure: COLONOSCOPY WITH BIOPSY;  Surgeon: [name], MD;  Location: [place];  Service: Endoscopy;  Laterality: N/A;  priority 4   DRUG INDUCED ENDOSCOPY N/A 09/22/2020   Procedure: DRUG INDUCED ENDOSCOPY;  Surgeon: Melida Quitter, MD;  Location: [place];  Service: ENT;  Laterality: N/A;   EYE SURGERY Left 07/2019   retina hole surgery   IMPLANTATION OF HYPOGLOSSAL NERVE STIMULATOR Right 12/15/2020   Procedure: IMPLANTATION OF HYPOGLOSSAL NERVE STIMULATOR;  Surgeon: Melida Quitter, MD;  Location: [place];  Service: ENT;  Laterality: Right;   MASTECTOMY Left 2003   OVARIAN CYST REMOVAL Right    POLYPECTOMY N/A 04/19/2020   Procedure: POLYPECTOMY;  Surgeon: [name], MD;  Location: [place];  Service: Endoscopy;  Laterality: N/A;   REDUCTION MAMMAPLASTY Right    TONSILLECTOMY     TRIGGER FINGER RELEASE       PHYSICAL EXAM:  VS: BP (!) 144/87 (BP Location: Left Arm, Patient Position: Sitting)   Ht 5' 3.5" (1.613 m)   Wt 185 lb (83.9 kg)   BMI 32.26 kg/m  Physical Exam Gen: NAD, alert, cooperative with exam, well-appearing MSK:  Neurovascularly intact  Limited ultrasound: Left elbow and right hand:  Left elbow: Normal insertion of the triceps tendon. Small bony avulsion off of the radial aspect of the olecranon.  Right hand: Degenerative changes of the [facility] joint with effusion. No acute changes of the second or third MCP joint.  Summary: Avulsion fracture of the olecranon.  Ultrasound and interpretation by Clearance Coots, MD    ASSESSMENT & PLAN:   Greater trochanteric pain syndrome of left lower extremity Acutely occurring after most recent fall.  Has spurring and degenerative changes appreciated on previous x-ray. -Counseled on home exercise therapy and  supportive care. -Prednisone. -Could consider injection or physical therapy  Avulsion fracture Acutely occurring after most recent fall into the pool.  Small avulsion appreciated on ultrasound. -Counseled on home exercise therapy and supportive care. -Counseled on compression  Arthritis of carpometacarpal (CMC) joint of both thumbs Acute on chronic in nature.  No acute changes appreciated. -Counseled on home exercise therapy and supportive care. -Could consider injection or lab work.

## 2022-02-16 NOTE — Assessment & Plan Note (Addendum)
Acutely occurring after most recent fall into the pool.  Small avulsion appreciated on ultrasound. -Counseled on home exercise therapy and supportive care. -Counseled on compression

## 2022-02-16 NOTE — Patient Instructions (Signed)
Nice to meet you Please alternate heat and ice on the hip  Please try the exercises  Please try to cushion the elbow  Please send me a message in MyChart with any questions or updates.  Please see me back in 3-4 weeks.   --Dr. Raeford Razor

## 2022-02-16 NOTE — Assessment & Plan Note (Signed)
Acute on chronic in nature.  No acute changes appreciated. -Counseled on home exercise therapy and supportive care. -Could consider injection or lab work.

## 2022-02-16 NOTE — Assessment & Plan Note (Signed)
Acutely occurring after most recent fall.  Has spurring and degenerative changes appreciated on previous x-ray. -Counseled on home exercise therapy and supportive care. -Prednisone. -Could consider injection or physical therapy

## 2022-03-07 ENCOUNTER — Other Ambulatory Visit: Payer: Self-pay

## 2022-03-07 DIAGNOSIS — E785 Hyperlipidemia, unspecified: Secondary | ICD-10-CM

## 2022-03-09 ENCOUNTER — Ambulatory Visit: Payer: Medicare Other | Admitting: Family Medicine

## 2022-03-09 ENCOUNTER — Encounter: Payer: Self-pay | Admitting: Family Medicine

## 2022-03-09 DIAGNOSIS — M25552 Pain in left hip: Secondary | ICD-10-CM

## 2022-03-09 DIAGNOSIS — T148XXA Other injury of unspecified body region, initial encounter: Secondary | ICD-10-CM

## 2022-03-09 NOTE — Assessment & Plan Note (Signed)
No pain of the elbow from the fracture appreciated on ultrasound. -Counseled on home exercise therapy and supportive care. -Follow-up As needed.

## 2022-03-09 NOTE — Assessment & Plan Note (Signed)
Doing well with the exercises in place.  She is able to walk with no pain. -Counseled on home exercise therapy and supportive care. -Could consider physical therapy.

## 2022-03-09 NOTE — Progress Notes (Signed)
Tiffany Jarvis - 71 y.o. female MRN [id number]  Date of birth: 1951-01-04  SUBJECTIVE:  Including CC & ROS.  No chief complaint on file.   Tiffany Jarvis is a 71 y.o. female that is following up for her leg pain and elbow pain.  She is feeling well with the exercises.  She has not had to use any medication.    Review of Systems See HPI   HISTORY: Past Medical, Surgical, Social, and Family History Reviewed & Updated per EMR.   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    Colon polyps    Depression    History of chicken pox    Hyperlipidemia    Hypertension    Influenza due to influenza virus, type B 08/25/2018   OSA (obstructive sleep apnea)    not able to tolerate cpap or dental mouth piece    Vertigo    CT neck and head negative significant CAS (1 episode - "yrs ago")    Past Surgical History:  Procedure Laterality Date   BREAST BIOPSY Left 2003   positive   BREAST SURGERY     L mastectomy ~2001/2002 and reconstruction   CATARACT EXTRACTION W/PHACO Left 01/28/2020   Procedure: CATARACT EXTRACTION PHACO AND INTRAOCULAR LENS PLACEMENT ([place]) LEFT;  Surgeon: Leandrew Koyanagi, MD;  Location: [place];  Service: Ophthalmology;  Laterality: Left;  5.61 0:53.0 10.6%   CATARACT EXTRACTION W/PHACO Right 03/03/2020   Procedure: CATARACT EXTRACTION PHACO AND INTRAOCULAR LENS PLACEMENT ([place]) RIGHT;  Surgeon: Leandrew Koyanagi, MD;  Location: [place];  Service: Ophthalmology;  Laterality: Right;  2.52 0:41.6 6.0%   CESAREAN SECTION     x 3 1979, 1982, 1992    COLONOSCOPY     COLONOSCOPY WITH PROPOFOL N/A 04/19/2020   Procedure: COLONOSCOPY WITH BIOPSY;  Surgeon: [name], MD;  Location: [place];  Service: Endoscopy;  Laterality: N/A;  priority 4   DRUG INDUCED ENDOSCOPY N/A 09/22/2020   Procedure: DRUG INDUCED ENDOSCOPY;  Surgeon: Melida Quitter, MD;  Location: [place];  Service: ENT;  Laterality: N/A;   EYE SURGERY Left 07/2019   retina hole surgery   IMPLANTATION OF HYPOGLOSSAL NERVE STIMULATOR Right 12/15/2020   Procedure: IMPLANTATION OF HYPOGLOSSAL NERVE STIMULATOR;  Surgeon: Melida Quitter, MD;  Location: [place];  Service: ENT;  Laterality: Right;   MASTECTOMY Left 2003   OVARIAN CYST REMOVAL Right    POLYPECTOMY N/A 04/19/2020   Procedure: POLYPECTOMY;  Surgeon: [name], MD;  Location: [place];  Service: Endoscopy;  Laterality: N/A;   REDUCTION MAMMAPLASTY Right    TONSILLECTOMY     TRIGGER FINGER RELEASE       PHYSICAL EXAM:  VS: BP 128/84 (BP Location: Left Arm, Patient Position: Sitting)   Ht 5' 3.5" (1.613 m)   Wt 185 lb (83.9 kg)   BMI 32.26 kg/m  Physical Exam Gen: NAD, alert, cooperative with exam, well-appearing MSK:  Neurovascularly intact       ASSESSMENT & PLAN:   Avulsion fracture No pain of the elbow from the fracture appreciated on ultrasound. -Counseled on home exercise therapy and supportive care. -Follow-up As needed.  Greater trochanteric pain syndrome of left lower extremity Doing well with the exercises in place.  She is able to walk with no pain. -Counseled on home exercise therapy and supportive care. -Could  consider physical therapy.

## 2022-03-28 ENCOUNTER — Ambulatory Visit
Admission: EM | Admit: 2022-03-28 | Discharge: 2022-03-28 | Disposition: A | Discharge status: Home / Self Care | Payer: Medicare Other | Attending: Family Medicine | Admitting: Family Medicine

## 2022-03-28 ENCOUNTER — Ambulatory Visit: Admit: 2022-03-28 | Discharge status: Absent without leave | Payer: Medicare Other

## 2022-03-28 DIAGNOSIS — U071 COVID-19: Secondary | ICD-10-CM | POA: Diagnosis present

## 2022-03-28 DIAGNOSIS — J069 Acute upper respiratory infection, unspecified: Secondary | ICD-10-CM

## 2022-03-28 LAB — RESP PANEL BY RT-PCR (FLU A&B, COVID) ARPGX2
Influenza A by PCR: NEGATIVE
Influenza B by PCR: NEGATIVE
SARS Coronavirus 2 by RT PCR: POSITIVE — AB

## 2022-03-28 NOTE — Discharge Instructions (Signed)
Take plain guaifenesin ('1200mg'$  extended release tabs such as Mucinex) twice daily, with plenty of water, for cough and congestion. Get adequate rest.   May take Delsym Cough Suppressant ("12 Hour Cough Relief") at bedtime for nighttime cough.  Try warm salt water gargles for sore throat.  Stop all antihistamines (Claritin, etc) for now, and other non-prescription cough/cold preparations. May take Tylenol as needed for fever.  If your COVID-19 test is positive, isolate yourself for five days from the time of your symptom onset. At the end of five days you may end isolation if your symptoms have cleared or improved, and you have not had a fever for 24 hours. At this time you should wear a mask for five more days when you are around others.   If symptoms become significantly worse during the night or over the weekend, proceed to the local emergency room.

## 2022-03-28 NOTE — ED Provider Notes (Signed)
Tiffany Jarvis CARE    CSN: [id number] Arrival date & time: 03/28/22  0955      History   Chief Complaint Chief Complaint  Patient presents with   Cough   Fever    HPI Tiffany Jarvis is a 71 y.o. female.   Two days ago patient developed nasal drainage and sneezing, followed by fever 100.4, chills, non-productive cough, loose stools, decreased appetite, and fatigue.  Her cough is now worse at night.  She denies pleuritic pain or shortness of breath.  The history is provided by the patient.    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    Colon polyps    Depression    History of chicken pox    Hyperlipidemia    Hypertension    Influenza due to influenza virus, type B 08/25/2018   OSA (obstructive sleep apnea)    not able to tolerate cpap or dental mouth piece    Vertigo    CT neck and head negative significant CAS (1 episode - "yrs ago")    Patient Active Problem List   Diagnosis Date Noted   Avulsion fracture 02/16/2022   Greater trochanteric pain syndrome of left lower extremity 02/16/2022   Arthritis of carpometacarpal (CMC) joint of both thumbs 02/16/2022   Hx of epistaxis 11/05/2021   Fatty infiltration of liver 04/07/2021   Personal history of colonic polyps    Polyp of transverse colon    Bruxism, 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   Encounter for preventive health examination 03/09/2019   Anxiety 07/31/2018   Obesity (BMI 30-39.9) 05/29/2018   History of ductal carcinoma in situ (DCIS) of breast 05/28/2018   Prediabetes 05/28/2018   HTN (hypertension) 05/28/2018   HLD (hyperlipidemia) 05/28/2018   Intrinsic eczema 02/25/2018   Anxiety and depression 09/17/2017   Adenomatous colon polyp 11/09/2015   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   Family history of breast cancer 11/19/2014   Sleep apnea 03/01/2014   Dizziness 03/01/2014   Abnormal glucose 03/01/2014   Allergic  rhinitis 03/01/2014   Benign neoplasm of colon 03/01/2014   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   Insomnia 03/01/2014   Migraine 03/01/2014   Pure hypercholesterolemia 01/31/2012    Past Surgical History:  Procedure Laterality Date   BREAST BIOPSY Left 2003   positive   BREAST SURGERY     L mastectomy ~2001/2002 and reconstruction   CATARACT EXTRACTION W/PHACO Left 01/28/2020   Procedure: CATARACT EXTRACTION PHACO AND INTRAOCULAR LENS PLACEMENT ([place]) LEFT;  Surgeon: Leandrew Koyanagi, MD;  Location: [facility];  Service: Ophthalmology;  Laterality: Left;  5.61 0:53.0 10.6%   CATARACT EXTRACTION W/PHACO Right 03/03/2020   Procedure: CATARACT EXTRACTION PHACO AND INTRAOCULAR LENS PLACEMENT ([place]) RIGHT;  Surgeon: Leandrew Koyanagi, MD;  Location: [facility];  Service: Ophthalmology;  Laterality: Right;  2.52 0:41.6 6.0%   CESAREAN SECTION     x 3 1979, 1982, 1992    COLONOSCOPY     COLONOSCOPY WITH PROPOFOL N/A 04/19/2020   Procedure: COLONOSCOPY WITH BIOPSY;  Surgeon: [name], MD;  Location: [facility];  Service: Endoscopy;  Laterality: N/A;  priority 4   DRUG INDUCED ENDOSCOPY N/A 09/22/2020   Procedure: DRUG INDUCED ENDOSCOPY;  Surgeon: Melida Quitter, MD;  Location: [facility];  Service: ENT;  Laterality: N/A;   EYE SURGERY Left 07/2019   retina hole surgery  IMPLANTATION OF HYPOGLOSSAL NERVE STIMULATOR Right 12/15/2020   Procedure: IMPLANTATION OF HYPOGLOSSAL NERVE STIMULATOR;  Surgeon: Melida Quitter, MD;  Location: [facility];  Service: ENT;  Laterality: Right;   MASTECTOMY Left 2003   OVARIAN CYST REMOVAL Right    POLYPECTOMY N/A 04/19/2020   Procedure: POLYPECTOMY;  Surgeon: [name], MD;  Location: [facility];  Service: Endoscopy;  Laterality: N/A;   REDUCTION MAMMAPLASTY Right    TONSILLECTOMY     TRIGGER FINGER RELEASE      OB History   No obstetric history on file.      Home  Medications    Prior to Admission medications   Medication Sig Start Date End Date Taking? Authorizing Provider  amLODipine (NORVASC) 2.5 MG tablet TAKE 1 TABLET BY MOUTH EVERY DAY 02/12/22   Dutch Quint B, FNP  Calcium Carbonate (CALCIUM 600 PO) Take 1 tablet by mouth daily.    [provider]  celecoxib (CELEBREX) 100 MG capsule TAKE 1 CAPSULE BY MOUTH TWICE  DAILY AS NEEDED FOR MODERATE  PAIN Patient not taking: Reported on 03/28/2022 01/09/22   Crecencio Mc, MD  lisinopril-hydrochlorothiazide (ZESTORETIC) 20-12.5 MG tablet TAKE 2 TABLETS BY MOUTH DAILY IN THE MORNING 02/12/22   Kennyth Arnold, FNP  Misc Natural Products (JOINT HEALTH PO) Take by mouth.    [provider]  Multiple Vitamin (MULTI-VITAMIN DAILY PO) Multi Vitamin    [provider]  Multiple Vitamins-Minerals (VITAMIN D3 COMPLETE PO) Take 2,000 Units by mouth daily.    [provider]  Omega-3 Fatty Acids (FISH OIL PO) Take by mouth.    [provider]  pravastatin (PRAVACHOL) 40 MG tablet TAKE 1 TABLET BY MOUTH EVERYDAY AT BEDTIME 02/12/22   Dutch Quint B, FNP  predniSONE (DELTASONE) 5 MG tablet Take 6 pills for first day, 5 pills second day, 4 pills third day, 3 pills fourth day, 2 pills the fifth day, and 1 pill sixth day. 02/16/22   Rosemarie Ax, MD  venlafaxine XR (EFFEXOR-XR) 150 MG 24 hr capsule TAKE 1 CAPSULE BY MOUTH DAILY WITH BREAKFAST. 02/11/22   Kennyth Arnold, FNP    Family History Family History  Problem Relation Age of Onset   Cancer Sister        breast cancer    Breast cancer Sister    Cancer Cousin        m 1st cousin breast and pancreatic ca   Breast cancer Cousin    Cancer Cousin        breast m 1st cousin   Breast cancer Cousin    Cancer Cousin        m 1st cousin breast    Breast cancer Cousin    Cancer Cousin        m 1st cousin breast    Breast cancer Cousin    Cancer Mother        colon cancer    Social History Social History    Tobacco Use   Smoking status: Former    Packs/day: 0.25    Years: 3.00    Total pack years: 0.75    Types: Cigarettes    Quit date: 1970    Years since quitting: 53.7   Smokeless tobacco: Never   Tobacco comments:    former smoker x 3 years 19-22 light   Vaping Use   Vaping Use: Never used  Substance Use Topics   Alcohol use: Yes    Alcohol/week: 1.0 standard  drink of alcohol    Types: 1 Glasses of wine per week    Comment: occ   Drug use: Never     Allergies   Patient has no known allergies.   Review of Systems Review of Systems No sore throat + cough + sneezing No pleuritic pain No wheezing + nasal congestion + post-nasal drainage No sinus pain/pressure No itchy/red eyes No earache No hemoptysis No SOB + fever, + chills No nausea No vomiting No abdominal pain + diarrhea No urinary symptoms No skin rash + fatigue No myalgias No headache Used OTC meds (Claritin) without relief   Physical Exam Triage Vital Signs ED Triage Vitals  Enc Vitals Group     BP 03/28/22 1017 128/84     Pulse Rate 03/28/22 1017 81     Resp 03/28/22 1017 14     Temp 03/28/22 1017 99.2 F (37.3 C)     Temp Source 03/28/22 1017 Oral     SpO2 03/28/22 1017 97 %     Weight --      Height --      Head Circumference --      Peak Flow --      Pain Score 03/28/22 1016 0     Pain Loc --      Pain Edu? --      Excl. in [place]? --    No data found.  Updated Vital Signs BP 128/84 (BP Location: Right Arm)   Pulse 81   Temp 99.2 F (37.3 C) (Oral)   Resp 14   SpO2 97%   Visual Acuity Right Eye Distance:   Left Eye Distance:   Bilateral Distance:    Right Eye Near:   Left Eye Near:    Bilateral Near:     Physical Exam Nursing notes and Vital Signs reviewed. Appearance:  Patient appears stated age, and in no acute distress Eyes:  Pupils are equal, round, and reactive to light and accomodation.  Extraocular movement is intact.  Conjunctivae are not inflamed  Ears:   Canals normal.  Tympanic membranes normal.  Nose:  Mildly congested turbinates.  No sinus tenderness.   Pharynx:  Normal Neck:  Supple.  Mildly enlarged lateral nodes are present, tender to palpation on the left.   Lungs:  Clear to auscultation.  Breath sounds are equal.  Moving air well. Heart:  Regular rate and rhythm without murmurs, rubs, or gallops.  Abdomen:  Nontender without masses or hepatosplenomegaly.  Bowel sounds are present.  No CVA or flank tenderness.  Extremities:  No edema.  Skin:  No rash present.   UC Treatments / Results  Labs (all labs ordered are listed, but only abnormal results are displayed) Labs Reviewed  RESP PANEL BY RT-PCR (FLU A&B, COVID) [id number]    EKG   Radiology No results found.  Procedures Procedures (including critical care time)  Medications Ordered in UC Medications - No data to display  Initial Impression / Assessment and Plan / UC Course  I have reviewed the triage vital signs and the nursing notes.  Pertinent labs & imaging results that were available during my care of the patient were reviewed by me and considered in my medical decision making (see chart for details).    There is no evidence of bacterial infection today.  Treat symptomatically for now.  If COVID positive, begin Paxlovid 300/150 (note normal renal function on 11/02/21). Followup with Family Doctor if not improved in one week.   Final Clinical Impressions(s) /  UC Diagnoses   Final diagnoses:  Viral URI with cough     Discharge Instructions      Take plain guaifenesin ('1200mg'$  extended release tabs such as Mucinex) twice daily, with plenty of water, for cough and congestion. Get adequate rest.   May take Delsym Cough Suppressant ("12 Hour Cough Relief") at bedtime for nighttime cough.  Try warm salt water gargles for sore throat.  Stop all antihistamines (Claritin, etc) for now, and other non-prescription cough/cold preparations. May take Tylenol as needed for  fever.  If your COVID-19 test is positive, isolate yourself for five days from the time of your symptom onset. At the end of five days you may end isolation if your symptoms have cleared or improved, and you have not had a fever for 24 hours. At this time you should wear a mask for five more days when you are around others.   If symptoms become significantly worse during the night or over the weekend, proceed to the local emergency room.         ED Prescriptions   None       Kandra Nicolas, MD 03/29/22 [phone]

## 2022-03-28 NOTE — ED Triage Notes (Signed)
Pt presents with c/o fatigue, chills, cough, and fever x 2 days

## 2022-03-29 ENCOUNTER — Telehealth: Payer: Self-pay | Admitting: Family Medicine

## 2022-03-29 MED ORDER — PAXLOVID (300/100) 20 X 150 MG & 10 X 100MG PO TBPK: 6.0000 | ORAL_TABLET | Freq: Two times a day (BID) | ORAL | 0 refills | Status: DC

## 2022-03-29 NOTE — Telephone Encounter (Signed)
ASSESSMENT: COVID positive PLAN: Begin Paxlovid '300mg'$ /'100mg'$ . Advise patient to do the following while taking Paxlovid: 1)  Decrease Norvasc (amlodipine) by 50% (cut tablet in half) 2)  Stop pravastatin (Pravachol) 3)  Take venlafaxine XR (Effexor XR) one cap every other day.

## 2022-03-30 NOTE — Telephone Encounter (Signed)
TCT pt to notify her of new rx sent to pharmacy as well as provider recommendations. Pt states that she has already began to take paxlovid and is following recommendations. Pt denies any additional needs at this time.

## 2022-04-02 ENCOUNTER — Other Ambulatory Visit: Payer: Self-pay | Admitting: Internal Medicine

## 2022-04-02 DIAGNOSIS — I10 Essential (primary) hypertension: Secondary | ICD-10-CM

## 2022-04-03 ENCOUNTER — Telehealth: Payer: Self-pay

## 2022-04-03 ENCOUNTER — Ambulatory Visit: Payer: Medicare Other | Admitting: Radiation Oncology

## 2022-04-03 ENCOUNTER — Telehealth: Payer: Self-pay | Admitting: Internal Medicine

## 2022-04-03 ENCOUNTER — Other Ambulatory Visit (HOSPITAL_COMMUNITY): Payer: Self-pay

## 2022-04-03 DIAGNOSIS — I10 Essential (primary) hypertension: Secondary | ICD-10-CM

## 2022-04-03 MED ORDER — LISINOPRIL-HYDROCHLOROTHIAZIDE 20-12.5 MG PO TABS
2.0000 | ORAL_TABLET | Freq: Every morning | ORAL | 0 refills | Status: DC
  Filled 2022-04-03: qty 60, 30d supply, fill #0

## 2022-04-03 NOTE — Telephone Encounter (Signed)
Patient states her refill for lisinopril-hydrochlorothiazide (ZESTORETIC) 20-12.5 MG tablet should have gone to mail order with [facility].  Patient states she received a message saying she should pick up her medication in [place] at [facility].  Patient states she would like to received this as a mail order prescription instead.

## 2022-04-03 NOTE — Telephone Encounter (Signed)
Patient call states she is recovering from covid and would like to reschedule tomorrow's apt to one day next week and not a Mon. Will call patient once mao is rescheduled

## 2022-04-04 ENCOUNTER — Ambulatory Visit: Payer: Medicare Other | Admitting: Radiation Oncology

## 2022-04-04 ENCOUNTER — Other Ambulatory Visit (HOSPITAL_COMMUNITY): Payer: Self-pay

## 2022-04-04 MED ORDER — LISINOPRIL-HYDROCHLOROTHIAZIDE 20-12.5 MG PO TABS: 2.0000 | ORAL_TABLET | Freq: Every morning | ORAL | 1 refills | Status: DC

## 2022-04-04 NOTE — Telephone Encounter (Signed)
Medication has been resent to mail order.

## 2022-04-04 NOTE — Addendum Note (Signed)
Addended by: Adair Laundry on: 04/04/2022 04:30 PM   Modules accepted: Orders

## 2022-04-14 ENCOUNTER — Other Ambulatory Visit: Payer: Self-pay

## 2022-04-14 DIAGNOSIS — R232 Flushing: Secondary | ICD-10-CM

## 2022-04-14 MED ORDER — VENLAFAXINE HCL ER 150 MG PO CP24: 150.0000 mg | ORAL_CAPSULE | Freq: Every day | ORAL | 0 refills | Status: DC

## 2022-04-19 ENCOUNTER — Telehealth (INDEPENDENT_AMBULATORY_CARE_PROVIDER_SITE_OTHER): Payer: Medicare Other | Admitting: Family Medicine

## 2022-04-19 ENCOUNTER — Encounter: Payer: Self-pay | Admitting: Family Medicine

## 2022-04-19 DIAGNOSIS — J302 Other seasonal allergic rhinitis: Secondary | ICD-10-CM | POA: Diagnosis not present

## 2022-04-19 MED ORDER — DESLORATADINE 5 MG PO TABS: 5.0000 mg | ORAL_TABLET | Freq: Every day | ORAL | 1 refills | Status: DC

## 2022-04-19 NOTE — Assessment & Plan Note (Signed)
Patient likely having a flare of allergic rhinitis.  At this point in time this should not be COVID given how recently she had COVID.  This also would not represent a Paxlovid rebound given how long its been since she has come off of the Paxlovid.  We will trial Clarinex 5 mg daily as that has worked well for her previously.  If it is not helpful she will let us know.

## 2022-04-19 NOTE — Progress Notes (Signed)
Virtual Visit via video Note  This visit type was conducted due to national recommendations for restrictions regarding the COVID-19 pandemic (e.g. social distancing).  This format is felt to be most appropriate for this patient at this time.  All issues noted in this document were discussed and addressed.  No physical exam was performed (except for noted visual exam findings with Video Visits).   I connected with Tiffany Jarvis today at  3:15 PM EDT by a video enabled telemedicine application and verified that I am speaking with the correct person using two identifiers. Location patient: home Location provider: work Persons participating in the virtual visit: patient, provider  I discussed the limitations, risks, security and privacy concerns of performing an evaluation and management service by telephone and the availability of in person appointments. I also discussed with the patient that there may be a patient responsible charge related to this service. The patient expressed understanding and agreed to proceed.   Reason for visit: same day visit   HPI: Allergic rhinitis: Patient reports allergy symptoms.  She notes she had COVID in September.  Its been more than 10 days since she finished the Paxlovid.  She notes she fully recovered from [place].  Over the last couple of days she has had some itchy sinuses with some rhinorrhea and sneezing as well as some congestion.  No cough or fever.  No taste or smell disturbances.  In the past she took Clarinex for this with good benefit.   ROS: See pertinent positives and negatives per HPI.  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    Colon polyps    Depression    History of chicken pox    Hyperlipidemia    Hypertension    Influenza due to influenza virus, type B 08/25/2018   OSA (obstructive sleep apnea)    not able to tolerate cpap or dental mouth piece    Vertigo    CT neck  and head negative significant CAS (1 episode - "yrs ago")    Past Surgical History:  Procedure Laterality Date   BREAST BIOPSY Left 2003   positive   BREAST SURGERY     L mastectomy ~2001/2002 and reconstruction   CATARACT EXTRACTION W/PHACO Left 01/28/2020   Procedure: CATARACT EXTRACTION PHACO AND INTRAOCULAR LENS PLACEMENT ([place]) LEFT;  Surgeon: Leandrew Koyanagi, MD;  Location: [facility];  Service: Ophthalmology;  Laterality: Left;  5.61 0:53.0 10.6%   CATARACT EXTRACTION W/PHACO Right 03/03/2020   Procedure: CATARACT EXTRACTION PHACO AND INTRAOCULAR LENS PLACEMENT ([place]) RIGHT;  Surgeon: Leandrew Koyanagi, MD;  Location: [facility];  Service: Ophthalmology;  Laterality: Right;  2.52 0:41.6 6.0%   CESAREAN SECTION     x 3 1979, 1982, 1992    COLONOSCOPY     COLONOSCOPY WITH PROPOFOL N/A 04/19/2020   Procedure: COLONOSCOPY WITH BIOPSY;  Surgeon: [name], MD;  Location: [facility];  Service: Endoscopy;  Laterality: N/A;  priority 4   DRUG INDUCED ENDOSCOPY N/A 09/22/2020   Procedure: DRUG INDUCED ENDOSCOPY;  Surgeon: Melida Quitter, MD;  Location: [facility];  Service: ENT;  Laterality: N/A;   EYE SURGERY Left 07/2019   retina hole surgery   IMPLANTATION OF HYPOGLOSSAL NERVE STIMULATOR Right 12/15/2020   Procedure: IMPLANTATION OF HYPOGLOSSAL NERVE STIMULATOR;  Surgeon: Melida Quitter, MD;  Location: [facility];  Service: ENT;  Laterality: Right;  MASTECTOMY Left 2003   OVARIAN CYST REMOVAL Right    POLYPECTOMY N/A 04/19/2020   Procedure: POLYPECTOMY;  Surgeon: [name], MD;  Location: [facility];  Service: Endoscopy;  Laterality: N/A;   REDUCTION MAMMAPLASTY Right    TONSILLECTOMY     TRIGGER FINGER RELEASE      Family History  Problem Relation Age of Onset   Cancer Sister        breast cancer    Breast cancer Sister    Cancer Cousin        m 1st cousin breast and pancreatic ca   Breast cancer Cousin     Cancer Cousin        breast m 1st cousin   Breast cancer Cousin    Cancer Cousin        m 1st cousin breast    Breast cancer Cousin    Cancer Cousin        m 1st cousin breast    Breast cancer Cousin    Cancer Mother        colon cancer    SOCIAL HX: Former smoker   Current Outpatient Medications:    amLODipine (NORVASC) 2.5 MG tablet, TAKE 1 TABLET BY MOUTH EVERY DAY, Disp: 30 tablet, Rfl: 0   Calcium Carbonate (CALCIUM 600 PO), Take 1 tablet by mouth daily., Disp: , Rfl:    desloratadine (CLARINEX) 5 MG tablet, Take 1 tablet (5 mg total) by mouth daily., Disp: 90 tablet, Rfl: 1   lisinopril-hydrochlorothiazide (ZESTORETIC) 20-12.5 MG tablet, Take 2 tablets by mouth every morning., Disp: 180 tablet, Rfl: 1   Misc Natural Products (JOINT HEALTH PO), Take by mouth., Disp: , Rfl:    Multiple Vitamin (MULTI-VITAMIN DAILY PO), Multi Vitamin, Disp: , Rfl:    Multiple Vitamins-Minerals (VITAMIN D3 COMPLETE PO), Take 2,000 Units by mouth daily., Disp: , Rfl:    nirmatrelvir & ritonavir (PAXLOVID, 300/100,) 20 x 150 MG & 10 x '100MG'$  TBPK, Take 6 tablets by mouth 2 (two) times daily. Follow instructions on Convenience Pack.  Use one blister card each day, Disp: 30 tablet, Rfl: 0   Omega-3 Fatty Acids (FISH OIL PO), Take by mouth., Disp: , Rfl:    pravastatin (PRAVACHOL) 40 MG tablet, TAKE 1 TABLET BY MOUTH EVERYDAY AT BEDTIME, Disp: 30 tablet, Rfl: 0   venlafaxine XR (EFFEXOR-XR) 150 MG 24 hr capsule, Take 1 capsule (150 mg total) by mouth daily with breakfast., Disp: 30 capsule, Rfl: 0  EXAM:  VITALS per patient if applicable:  GENERAL: alert, oriented, appears well and in no acute distress  HEENT: atraumatic, conjunttiva clear, no obvious abnormalities on inspection of external nose and ears  NECK: normal movements of the head and neck  LUNGS: on inspection no signs of respiratory distress, breathing rate appears normal, no obvious gross SOB, gasping or wheezing  CV: no obvious  cyanosis  MS: moves all visible extremities without noticeable abnormality  PSYCH/NEURO: pleasant and cooperative, no obvious depression or anxiety, speech and thought processing grossly intact  ASSESSMENT AND PLAN:  Discussed the following assessment and plan:  Problem List Items Addressed This Visit     Allergic rhinitis (Chronic)    Patient likely having a flare of allergic rhinitis.  At this point in time this should not be COVID given how recently she had COVID.  This also would not represent a Paxlovid rebound given how long its been since she has come off of the Paxlovid.  We will trial Clarinex 5  mg daily as that has worked well for her previously.  If it is not helpful she will let us know.       Return if symptoms worsen or fail to improve.   I discussed the assessment and treatment plan with the patient. The patient was provided an opportunity to ask questions and all were answered. The patient agreed with the plan and demonstrated an understanding of the instructions.   The patient was advised to call back or seek an in-person evaluation if the symptoms worsen or if the condition fails to improve as anticipated.   Tommi Rumps, MD

## 2022-04-25 ENCOUNTER — Ambulatory Visit: Payer: Medicare Other | Admitting: Radiation Oncology

## 2022-04-26 ENCOUNTER — Ambulatory Visit
Admission: RE | Admit: 2022-04-26 | Discharge: 2022-04-26 | Disposition: A | Discharge status: Home / Self Care | Payer: Medicare Other | Source: Ambulatory Visit | Attending: Internal Medicine | Admitting: Internal Medicine

## 2022-04-26 ENCOUNTER — Ambulatory Visit
Admission: RE | Admit: 2022-04-26 | Discharge: 2022-04-26 | Disposition: A | Discharge status: Home / Self Care | Payer: Medicare Other | Source: Ambulatory Visit | Attending: Radiation Oncology | Admitting: Radiation Oncology

## 2022-04-26 VITALS — BP 153/86 | HR 79 | Temp 97.1°F | Resp 16 | Ht 63.5 in | Wt 182.5 lb

## 2022-04-26 DIAGNOSIS — D0512 Intraductal carcinoma in situ of left breast: Secondary | ICD-10-CM

## 2022-04-26 DIAGNOSIS — Z923 Personal history of irradiation: Secondary | ICD-10-CM | POA: Diagnosis not present

## 2022-04-26 DIAGNOSIS — Z86000 Personal history of in-situ neoplasm of breast: Secondary | ICD-10-CM | POA: Diagnosis present

## 2022-04-26 DIAGNOSIS — Z9010 Acquired absence of unspecified breast and nipple: Secondary | ICD-10-CM | POA: Diagnosis not present

## 2022-04-26 DIAGNOSIS — Z1231 Encounter for screening mammogram for malignant neoplasm of breast: Secondary | ICD-10-CM | POA: Diagnosis present

## 2022-04-26 NOTE — Progress Notes (Signed)
Radiation Oncology Follow up Note  Name: Tiffany Jarvis   Date:   04/26/2022 MRN:  [id number] DOB: 05/31/1951    This 71 y.o. female presents to the clinic today for 3-year follow-up status post mastectomy 2003 for ductal carcinoma in situ never received treatment here just following her on a yearly basis.Marland Kitchen  REFERRING PROVIDER: Crecencio Mc, MD  HPI: Patient is a 71 year old female who I started following 3 years ago.  She status post mastectomy in 2003 for ductal carcinoma in situ.  Seen today in routine follow-up she is doing well specifically denies breast tenderness cough or bone pain.  She had a mammogram today which I will review when it becomes available.  She is not on endocrine therapy..  COMPLICATIONS OF TREATMENT: none  FOLLOW UP COMPLIANCE: keeps appointments   PHYSICAL EXAM:  BP (!) 153/86   Pulse 79   Temp (!) 97.1 F (36.2 C)   Resp 16   Ht 5' 3.5" (1.613 m)   Wt 182 lb 8 oz (82.8 kg)   BMI 31.82 kg/m  No evidence of mass or nodularity in either chest is identified.  She is status post partial mastectomy of the left breast.  No axillary or supraclavicular adenopathy is identified.  Well-developed well-nourished patient in NAD. HEENT reveals PERLA, EOMI, discs not visualized.  Oral cavity is clear. No oral mucosal lesions are identified. Neck is clear without evidence of cervical or supraclavicular adenopathy. Lungs are clear to A&P. Cardiac examination is essentially unremarkable with regular rate and rhythm without murmur rub or thrill. Abdomen is benign with no organomegaly or masses noted. Motor sensory and DTR levels are equal and symmetric in the upper and lower extremities. Cranial nerves II through XII are grossly intact. Proprioception is intact. No peripheral adenopathy or edema is identified. No motor or sensory levels are noted. Crude visual fields are within normal range.  RADIOLOGY RESULTS: Mammograms to be reviewed today  PLAN: Present time she continues  to do well with no evidence of disease.  I will review her mammogram when it is available today.  I have asked to see her back in 1 year for follow-up.  Patient is to call with any concerns.  I would like to take this opportunity to thank you for allowing me to participate in the care of your patient.Noreene Filbert, MD

## 2022-04-28 ENCOUNTER — Other Ambulatory Visit: Payer: Self-pay | Admitting: Internal Medicine

## 2022-04-28 DIAGNOSIS — R232 Flushing: Secondary | ICD-10-CM

## 2022-08-06 ENCOUNTER — Other Ambulatory Visit: Payer: Self-pay | Admitting: Internal Medicine

## 2022-08-06 DIAGNOSIS — I10 Essential (primary) hypertension: Secondary | ICD-10-CM

## 2022-10-30 ENCOUNTER — Telehealth: Payer: Self-pay | Admitting: Family

## 2022-10-30 ENCOUNTER — Encounter: Payer: Self-pay | Admitting: *Deleted

## 2022-10-30 DIAGNOSIS — R232 Flushing: Secondary | ICD-10-CM

## 2022-11-07 ENCOUNTER — Ambulatory Visit: Payer: Medicare Other | Admitting: Internal Medicine

## 2022-11-07 VITALS — BP 126/76 | HR 83 | Temp 98.1°F | Resp 16 | Ht 63.5 in | Wt 180.0 lb

## 2022-11-07 DIAGNOSIS — Z86000 Personal history of in-situ neoplasm of breast: Secondary | ICD-10-CM | POA: Diagnosis not present

## 2022-11-07 DIAGNOSIS — E785 Hyperlipidemia, unspecified: Secondary | ICD-10-CM

## 2022-11-07 DIAGNOSIS — R7309 Other abnormal glucose: Secondary | ICD-10-CM | POA: Diagnosis not present

## 2022-11-07 DIAGNOSIS — R1031 Right lower quadrant pain: Secondary | ICD-10-CM

## 2022-11-07 DIAGNOSIS — I10 Essential (primary) hypertension: Secondary | ICD-10-CM | POA: Diagnosis not present

## 2022-11-07 DIAGNOSIS — E559 Vitamin D deficiency, unspecified: Secondary | ICD-10-CM

## 2022-11-07 DIAGNOSIS — E669 Obesity, unspecified: Secondary | ICD-10-CM

## 2022-11-07 DIAGNOSIS — Z0000 Encounter for general adult medical examination without abnormal findings: Secondary | ICD-10-CM

## 2022-11-07 DIAGNOSIS — G4733 Obstructive sleep apnea (adult) (pediatric): Secondary | ICD-10-CM

## 2022-11-07 DIAGNOSIS — Z0001 Encounter for general adult medical examination with abnormal findings: Secondary | ICD-10-CM

## 2022-11-07 NOTE — Progress Notes (Signed)
Patient ID: Tiffany Jarvis, female    DOB: Jan 12, 1951  Age: 72 y.o. MRN: [id number]  The patient is here for annual preventive  examination and management of other chronic and acute problems.   The risk factors are reflected in the social history.  The roster of all physicians providing medical care to patient - is listed in the Snapshot section of the chart.  Activities of daily living:  The patient is 100% independent in all ADLs: dressing, toileting, feeding as well as independent mobility  Home safety : The patient has smoke detectors in the home. They wear seatbelts.  There are no firearms at home. There is no violence in the home.   There is no risks for hepatitis, STDs or HIV. There is no   history of blood transfusion. They have no travel history to infectious disease endemic areas of the world.  The patient has seen their dentist in the last six month. They have seen their eye doctor in the last year. They admit to slight hearing difficulty with regard to whispered voices and some television programs.  They have deferred audiologic testing in the last year.  They do not  have excessive sun exposure. Discussed the need for sun protection: hats, long sleeves and use of sunscreen if there is significant sun exposure.   Diet: the importance of a healthy diet is discussed. They do have a healthy diet.  The benefits of regular aerobic exercise were discussed. She walks 4 times per week ,  20 minutes.   Depression screen: there are no signs or vegative symptoms of depression- irritability, change in appetite, anhedonia, sadness/tearfullness.  Cognitive assessment: the patient manages all their financial and personal affairs and is actively engaged. T no sexualhey could relate day,date,year and events; recalled 2/3 objects at 3 minutes; performed clock-face test normally.  The following portions of the patient's history were reviewed and updated as appropriate: allergies, current medications,  past family history, past medical history,  past surgical history, past social history  and problem list.  Visual acuity was not assessed per patient preference since she has regular follow up with her ophthalmologist. Hearing and body mass index were assessed and reviewed.   During the course of the visit the patient was educated and counseled about appropriate screening and preventive services including : fall prevention , diabetes screening, nutrition counseling, colorectal cancer screening, and recommended immunizations.    CC: The primary encounter diagnosis was History of ductal carcinoma in situ (DCIS) of breast. Diagnoses of Obstructive sleep apnea syndrome, Hyperlipidemia, unspecified hyperlipidemia type, Primary hypertension, Abnormal glucose, Vitamin D deficiency, Obesity (BMI 30-39.9), Right inguinal pain, and Encounter for preventive health examination were also pertinent to this visit.  Unilateral right  breast mammo  Oct 2023  Colonoscopy    Right groin pain spontaneously occurred one week ago, woke up with it. Radiates to inguinal area.  Constant.  No vaginal discharge not sexually active.   Has been exercising   Losing weight intentionally  going to the gym 2/week , doing water yoga,  stretching and riding recumbent bicycle 90 mintues daily    History Rosealynn has a past medical history of Allergy, Cancer, Colon polyps, Depression, History of chicken pox, Hyperlipidemia, Hypertension, Influenza due to influenza virus, type B (08/25/2018), OSA (obstructive sleep apnea), and Vertigo.   She has a past surgical history that includes Cesarean section; Breast surgery; Mastectomy (Left, 2003); Breast biopsy (Left, 2003); Reduction mammaplasty (Right); Cataract extraction w/PHACO (Left, 01/28/2020); Trigger finger release;  Tonsillectomy; Ovarian cyst removal (Right); Colonoscopy; Eye surgery (Left, 07/2019); Cataract extraction w/PHACO (Right, 03/03/2020); Colonoscopy with propofol (N/A,  04/19/2020); polypectomy (N/A, 04/19/2020); Drug induced endoscopy (N/A, 09/22/2020); and Implantation of hypoglossal nerve stimulator (Right, 12/15/2020).   Her family history includes Breast cancer in her cousin, cousin, cousin, cousin, and sister; Cancer in her cousin, cousin, cousin, cousin, mother, and sister.She reports that she quit smoking about 54 years ago. Her smoking use included cigarettes. She has a 0.75 pack-year smoking history. She has never used smokeless tobacco. She reports current alcohol use of about 1.0 standard drink of alcohol per week. She reports that she does not use drugs.  Outpatient Medications Prior to Visit  Medication Sig Dispense Refill   amLODipine (NORVASC) 2.5 MG tablet TAKE 1 TABLET BY MOUTH EVERY DAY 30 tablet 0   Calcium Carbonate (CALCIUM 600 PO) Take 1 tablet by mouth daily.     desloratadine (CLARINEX) 5 MG tablet Take 1 tablet (5 mg total) by mouth daily. 90 tablet 1   lisinopril-hydrochlorothiazide (ZESTORETIC) 20-12.5 MG tablet TAKE 2 TABLETS BY MOUTH IN THE  MORNING 180 tablet 3   Misc Natural Products (JOINT HEALTH PO) Take by mouth.     Multiple Vitamin (MULTI-VITAMIN DAILY PO) Multi Vitamin     Multiple Vitamins-Minerals (VITAMIN D3 COMPLETE PO) Take 2,000 Units by mouth daily.     Omega-3 Fatty Acids (FISH OIL PO) Take by mouth.     pravastatin (PRAVACHOL) 40 MG tablet TAKE 1 TABLET BY MOUTH EVERYDAY AT BEDTIME 30 tablet 0   venlafaxine XR (EFFEXOR-XR) 150 MG 24 hr capsule Take 1 capsule (150 mg total) by mouth daily with breakfast. 30 capsule 0   nirmatrelvir & ritonavir (PAXLOVID, 300/100,) 20 x 150 MG & 10 x  TBPK Take 6 tablets by mouth 2 (two) times daily. Follow instructions on Convenience Pack.  Use one blister card each day 30 tablet 0   No facility-administered medications prior to visit.    Review of Systems  Patient denies headache, fevers, malaise, unintentional weight loss, skin rash, eye pain, sinus congestion and sinus pain,  sore throat, dysphagia,  hemoptysis , cough, dyspnea, wheezing, chest pain, palpitations, orthopnea, edema, abdominal pain, nausea, melena, diarrhea, constipation, flank pain, dysuria, hematuria, urinary  Frequency, nocturia, numbness, tingling, seizures,  Focal weakness, Loss of consciousness,  Tremor, insomnia, depression, anxiety, and suicidal ideation.     Objective:  BP 126/76   Pulse 83   Temp 98.1 F (36.7 C)   Resp 16   Ht 5' 3.5" (1.613 m)   Wt 180 lb (81.6 kg)   SpO2 98%   BMI 31.39 kg/m   Physical Exam Vitals reviewed.  Constitutional:      General: She is not in acute distress.    Appearance: Normal appearance. She is well-developed and normal weight. She is not ill-appearing, toxic-appearing or diaphoretic.  HENT:     Head: Normocephalic.     Right Ear: Tympanic membrane, ear canal and external ear normal. There is no impacted cerumen.     Left Ear: Tympanic membrane, ear canal and external ear normal. There is no impacted cerumen.     Nose: Nose normal.     Mouth/Throat:     Mouth: Mucous membranes are moist.     Pharynx: Oropharynx is clear.  Eyes:     General: No scleral icterus.       Right eye: No discharge.        Left eye: No discharge.  Conjunctiva/sclera: Conjunctivae normal.     Pupils: Pupils are equal, round, and reactive to light.  Neck:     Thyroid: No thyromegaly.     Vascular: No carotid bruit or JVD.  Cardiovascular:     Rate and Rhythm: Normal rate and regular rhythm.     Heart sounds: Normal heart sounds.  Pulmonary:     Effort: Pulmonary effort is normal. No respiratory distress.     Breath sounds: Normal breath sounds.  Chest:  Breasts:    Breasts are symmetrical.     Right: Normal. No swelling, inverted nipple, mass, nipple discharge, skin change or tenderness.     Left: Absent. No swelling, inverted nipple, mass, nipple discharge, skin change or tenderness.     Comments: Left breast reconstruction post mastectomy  Abdominal:      General: Bowel sounds are normal.     Palpations: Abdomen is soft. There is no mass.     Tenderness: There is no abdominal tenderness. There is no guarding or rebound.     Hernia: There is no hernia in the left inguinal area or right inguinal area.  Genitourinary:    General: Normal vulva.     Exam position: Lithotomy position.     Pubic Area: No rash or pubic lice.      Labia:        Right: No rash, tenderness, lesion or injury.        Left: No rash, tenderness, lesion or injury.      Vagina: Normal.     Cervix: Normal.     Uterus: Normal.      Adnexa: Right adnexa normal and left adnexa normal.     Comments: Multiple nearly confluent sebaceous cysts covering the posterior thir of her labia  Musculoskeletal:        General: Normal range of motion.     Cervical back: Normal range of motion and neck supple.  Lymphadenopathy:     Cervical: No cervical adenopathy.     Upper Body:     Right upper body: No supraclavicular, axillary or pectoral adenopathy.     Left upper body: No supraclavicular, axillary or pectoral adenopathy.     Lower Body: No right inguinal adenopathy. No left inguinal adenopathy.  Skin:    General: Skin is warm and dry.  Neurological:     General: No focal deficit present.     Mental Status: She is alert and oriented to person, place, and time. Mental status is at baseline.  Psychiatric:        Mood and Affect: Mood normal.        Behavior: Behavior normal.        Thought Content: Thought content normal.        Judgment: Judgment normal.      Assessment & Plan:  History of ductal carcinoma in situ (DCIS) of breast  Obstructive sleep apnea syndrome Assessment & Plan: Sleeping fine since receiving INSPIRE 2 years ago. Has deferred follow up sleep test   Orders: -     CBC with Differential/Platelet  Hyperlipidemia, unspecified hyperlipidemia type -     TSH -     Lipid panel -     LDL cholesterol, direct  Primary hypertension Assessment &  Plan: Well controlled on current regimen of amlodipine 2.5 mg  Lisinopril/hctz.  . Renal function stable, no changes today.  Lab Results  Component Value Date   CREATININE 0.61 11/02/2021   Lab Results  Component Value Date  NA 142 11/02/2021   K 3.6 11/02/2021   CL 104 11/02/2021   CO2 30 11/02/2021     Orders: -     Comprehensive metabolic panel  Abnormal glucose -     Hemoglobin A1c  Vitamin D deficiency -     VITAMIN D 25 Hydroxy (Vit-D Deficiency, Fractures)  Obesity (BMI 30-39.9) Assessment & Plan: I have congratulated her in reduction of   BMI and encouraged  Continued weight loss with goal of 10% of body weigh over the next 6 months using a low glycemic index diet and regular exercise a minimum of 5 days per week.     Right inguinal pain Assessment & Plan: Exam is consistent with muscle strain .    Encounter for preventive health examination Assessment & Plan: age appropriate education and counseling updated, referrals for preventative services and immunizations addressed, dietary and smoking counseling addressed, most recent labs reviewed.  I have personally reviewed and have noted:   1) the patient's medical and social history 2) The pt's use of alcohol, tobacco, and illicit drugs 3) The patient's current medications and supplements 4) Functional ability including ADL's, fall risk, home safety risk, hearing and visual impairment 5) Diet and physical activities 6) Evidence for depression or mood disorder 7) The patient's height, weight, and BMI have been recorded in the chartI have made referrals, and provided counseling and education based on review of the above        I provided 40 minutes of  face-to-face time during this encounter reviewing patient's current problems and past surgeries,  recent labs and imaging studies, providing counseling on the above mentioned problems , and coordination  of care .   Follow-up: No follow-ups on file.   Sherlene Shams, MD

## 2022-11-07 NOTE — Assessment & Plan Note (Signed)
Exam is consistent with muscle strain .

## 2022-11-07 NOTE — Assessment & Plan Note (Signed)
I have congratulated her in reduction of   BMI and encouraged  Continued weight loss with goal of 10% of body weigh over the next 6 months using a low glycemic index diet and regular exercise a minimum of 5 days per week.    

## 2022-11-07 NOTE — Assessment & Plan Note (Signed)
Well controlled on current regimen of amlodipine 2.5 mg  Lisinopril/hctz.  . Renal function stable, no changes today. ? ?Lab Results  ?Component Value Date  ? CREATININE 0.61 11/02/2021  ? ?Lab Results  ?Component Value Date  ? NA 142 11/02/2021  ? K 3.6 11/02/2021  ? CL 104 11/02/2021  ? CO2 30 11/02/2021  ? ? ?

## 2022-11-07 NOTE — Assessment & Plan Note (Signed)

## 2022-11-07 NOTE — Patient Instructions (Addendum)
Congratulations on losing 17 lbs the 'HONEST " WAY!   You are due for your tetanus-diptheria-pertussis vaccine   (TDaP)   You can  get it for free at you pharmacy   Your right hip pain is not from your vaginal area  A safer regimen for pain management is to reduce advil to 400  mg twice daily  and  add  take 2000 mg of acetominophen (tylenol) daily In divided doses (1000 mg every 12 hours.)

## 2022-11-07 NOTE — Assessment & Plan Note (Addendum)
Sleeping fine since receiving INSPIRE 2 years ago. Has deferred follow up sleep test

## 2022-11-08 LAB — CBC WITH DIFFERENTIAL/PLATELET
Basophils Absolute: 0.1 10*3/uL (ref 0.0–0.1)
Basophils Relative: 0.7 % (ref 0.0–3.0)
Eosinophils Absolute: 0.1 10*3/uL (ref 0.0–0.7)
Eosinophils Relative: 1.2 % (ref 0.0–5.0)
HCT: 38.3 % (ref 36.0–46.0)
Hemoglobin: 13.3 g/dL (ref 12.0–15.0)
Lymphocytes Relative: 35.8 % (ref 12.0–46.0)
Lymphs Abs: 2.6 10*3/uL (ref 0.7–4.0)
MCHC: 34.8 g/dL (ref 30.0–36.0)
MCV: 88.2 fl (ref 78.0–100.0)
Monocytes Absolute: 0.5 10*3/uL (ref 0.1–1.0)
Monocytes Relative: 6.5 % (ref 3.0–12.0)
Neutro Abs: 4 10*3/uL (ref 1.4–7.7)
Neutrophils Relative %: 55.8 % (ref 43.0–77.0)
Platelets: 236 10*3/uL (ref 150.0–400.0)
RBC: 4.34 Mil/uL (ref 3.87–5.11)
RDW: 13.1 % (ref 11.5–15.5)
WBC: 7.2 10*3/uL (ref 4.0–10.5)

## 2022-11-08 LAB — COMPREHENSIVE METABOLIC PANEL
ALT: 23 U/L (ref 0–35)
AST: 24 U/L (ref 0–37)
Albumin: 4.2 g/dL (ref 3.5–5.2)
Alkaline Phosphatase: 43 U/L (ref 39–117)
BUN: 14 mg/dL (ref 6–23)
CO2: 27 mEq/L (ref 19–32)
Calcium: 9.2 mg/dL (ref 8.4–10.5)
Chloride: 103 mEq/L (ref 96–112)
Creatinine, Ser: 0.63 mg/dL (ref 0.40–1.20)
GFR: 90.54 mL/min (ref >60.00)
Glucose, Bld: 91 mg/dL (ref 70–99)
Potassium: 3.8 mEq/L (ref 3.5–5.1)
Sodium: 142 mEq/L (ref 135–145)
Total Bilirubin: 0.6 mg/dL (ref 0.2–1.2)
Total Protein: 6.6 g/dL (ref 6.0–8.3)

## 2022-11-08 LAB — LIPID PANEL
Cholesterol: 203 mg/dL — ABNORMAL HIGH (ref 0–200)
HDL: 72.1 mg/dL (ref >39.00)
LDL Cholesterol: 104 mg/dL — ABNORMAL HIGH (ref 0–99)
NonHDL: 131.28
Total CHOL/HDL Ratio: 3
Triglycerides: 134 mg/dL (ref 0.0–149.0)
VLDL: 26.8 mg/dL (ref 0.0–40.0)

## 2022-11-08 LAB — LDL CHOLESTEROL, DIRECT: Direct LDL: 109 mg/dL

## 2022-11-08 LAB — TSH: TSH: 1.75 u[IU]/mL (ref 0.35–5.50)

## 2022-11-08 LAB — HEMOGLOBIN A1C: Hgb A1c MFr Bld: 5.9 % (ref 4.6–6.5)

## 2022-11-08 LAB — VITAMIN D 25 HYDROXY (VIT D DEFICIENCY, FRACTURES): VITD: 42.23 ng/mL (ref 30.00–100.00)

## 2022-11-10 NOTE — Telephone Encounter (Signed)
Medication has been sent to mail order pharmacy.  

## 2022-11-10 NOTE — Telephone Encounter (Signed)
Pt called stating she need a refill on venlafaxine sent to mail order and pt want everything to be [facility] [place] only when she sees the provider

## 2022-12-12 ENCOUNTER — Telehealth: Payer: Self-pay | Admitting: Internal Medicine

## 2022-12-12 NOTE — Telephone Encounter (Signed)
Pt called in staying that she's been having pain in legs  and hip, she feels like her whole body its swollen, also possible arthritis in fingers. She was wondering what Dr. Darrick Huntsman recommend for her to do? Maybe X-ray, a referral for another doctor, etc? Pt stated she lives 45 mins away from [place] so if she can do x-ray by her thats fine. Pt would like a call back regarding this.

## 2022-12-12 NOTE — Telephone Encounter (Signed)
Spoke with pt and she stated that she just left an orthopedic office. She stated that they doctor there is ordering a STAT CT scan of her right hip. Pt stated that she would keep [place] informed.

## 2022-12-13 ENCOUNTER — Other Ambulatory Visit: Payer: Self-pay | Admitting: Internal Medicine

## 2022-12-13 DIAGNOSIS — E785 Hyperlipidemia, unspecified: Secondary | ICD-10-CM

## 2022-12-13 DIAGNOSIS — I10 Essential (primary) hypertension: Secondary | ICD-10-CM

## 2023-01-04 ENCOUNTER — Encounter: Payer: Self-pay | Admitting: Internal Medicine

## 2023-01-04 NOTE — Telephone Encounter (Signed)
Abstracted into chart on 01/04/2023

## 2023-01-22 ENCOUNTER — Other Ambulatory Visit: Payer: Self-pay | Admitting: Radiation Oncology

## 2023-01-22 DIAGNOSIS — Z1231 Encounter for screening mammogram for malignant neoplasm of breast: Secondary | ICD-10-CM

## 2023-02-03 ENCOUNTER — Other Ambulatory Visit: Payer: Self-pay

## 2023-02-03 ENCOUNTER — Ambulatory Visit
Admission: RE | Admit: 2023-02-03 | Discharge: 2023-02-03 | Disposition: A | Discharge status: Home / Self Care | Payer: Medicare Other | Source: Ambulatory Visit | Attending: Family Medicine | Admitting: Family Medicine

## 2023-02-03 VITALS — BP 150/96 | HR 95 | Temp 98.2°F | Resp 16

## 2023-02-03 DIAGNOSIS — H00025 Hordeolum internum left lower eyelid: Secondary | ICD-10-CM

## 2023-02-03 HISTORY — DX: Spinal stenosis, site unspecified: M48.00

## 2023-02-03 HISTORY — DX: Unspecified osteoarthritis, unspecified site: M19.90

## 2023-02-03 MED ORDER — ERYTHROMYCIN 5 MG/GM OP OINT
TOPICAL_OINTMENT | Freq: Once | OPHTHALMIC | Status: AC
  Administered 2023-02-03: 1 via OPHTHALMIC

## 2023-02-03 NOTE — ED Provider Notes (Signed)
[facility]    CSN: [id number] Arrival date & time: 02/03/23  1225      History   Chief Complaint Chief Complaint  Patient presents with   Eye Problem    Entered by patient    HPI Tiffany Jarvis is a 72 y.o. female.   Patient has redness and irritation in her right eye today.  Some sticking of the lids when she woke up this morning.  No injury.  Normal vision    Past Medical History:  Diagnosis Date   Allergy    Arthritis    Cancer (HCC)    left breast  DCIS cancer bx 2001/2002    Colon polyps    Depression    History of chicken pox    Hyperlipidemia    Hypertension    Influenza due to influenza virus, type B 08/25/2018   OSA (obstructive sleep apnea)    not able to tolerate cpap or dental mouth piece    Spinal stenosis    Vertigo    CT neck and head negative significant CAS (1 episode - "yrs ago")    Patient Active Problem List   Diagnosis Date Noted   Right inguinal pain 11/07/2022   Avulsion fracture 02/16/2022   Greater trochanteric pain syndrome of left lower extremity 02/16/2022   Arthritis of carpometacarpal (CMC) joint of both thumbs 02/16/2022   Hx of epistaxis 11/05/2021   Fatty infiltration of liver 04/07/2021   Personal history of colonic polyps    Polyp of transverse colon    Bruxism, 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   Encounter for preventive health examination 03/09/2019   Anxiety 07/31/2018   Obesity (BMI 30-39.9) 05/29/2018   History of ductal carcinoma in situ (DCIS) of breast 05/28/2018   HTN (hypertension) 05/28/2018   HLD (hyperlipidemia) 05/28/2018   Intrinsic eczema 02/25/2018   Anxiety and depression 09/17/2017   Adenomatous colon polyp 11/09/2015   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   Family history of breast cancer 11/19/2014   Sleep apnea 03/01/2014   Abnormal glucose 03/01/2014   Allergic rhinitis 03/01/2014   Benign neoplasm of colon 03/01/2014   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   Pure  hypercholesterolemia 01/31/2012    Past Surgical History:  Procedure Laterality Date   BREAST BIOPSY Left 2003   positive   BREAST SURGERY     L mastectomy ~2001/2002 and reconstruction   CATARACT EXTRACTION W/PHACO Left 01/28/2020   Procedure: CATARACT EXTRACTION PHACO AND INTRAOCULAR LENS PLACEMENT (IOC) LEFT;  Surgeon: Lockie Mola, MD;  Location: [facility];  Service: Ophthalmology;  Laterality: Left;  5.61 0:53.0 10.6%   CATARACT EXTRACTION W/PHACO Right 03/03/2020   Procedure: CATARACT EXTRACTION PHACO AND INTRAOCULAR LENS PLACEMENT (IOC) RIGHT;  Surgeon: Lockie Mola, MD;  Location: [facility];  Service: Ophthalmology;  Laterality: Right;  2.52 0:41.6 6.0%   CESAREAN SECTION     x 3 1979, 1982, 1992    COLONOSCOPY     COLONOSCOPY WITH PROPOFOL N/A 04/19/2020   Procedure: COLONOSCOPY WITH BIOPSY;  Surgeon: Midge Minium, MD;  Location[facility];  Service: Endoscopy;  Laterality: N/A;  priority 4   DRUG INDUCED ENDOSCOPY N/A 09/22/2020   Procedure: DRUG INDUCED ENDOSCOPY;  Surgeon: Christia Reading, MD;  Location: [facility];  Service: ENT;  Laterality: N/A;   EYE SURGERY Left 07/2019   retina hole surgery   IMPLANTATION OF HYPOGLOSSAL NERVE STIMULATOR Right 12/15/2020   Procedure: IMPLANTATION OF HYPOGLOSSAL NERVE STIMULATOR;  Surgeon: Christia Reading, MD;  Location: [facility];  Service: ENT;  Laterality: Right;   MASTECTOMY Left 2003   OVARIAN CYST REMOVAL Right    POLYPECTOMY N/A 04/19/2020   Procedure: POLYPECTOMY;  Surgeon: Midge Minium, MD;  Location: [facility];  Service: Endoscopy;  Laterality: N/A;   REDUCTION MAMMAPLASTY Right    TONSILLECTOMY     TRIGGER FINGER RELEASE      OB History   No obstetric history on file.      Home Medications    Prior to Admission medications   Medication Sig Start Date End Date Taking? Authorizing Provider  amLODipine (NORVASC) 2.5 MG tablet TAKE 1 TABLET  BY MOUTH DAILY 12/14/22  Yes Sherlene Shams, MD  Calcium Carbonate (CALCIUM 600 PO) Take 1 tablet by mouth daily.   Yes [provider]  diclofenac (VOLTAREN) 75 MG EC tablet Take by mouth. 01/09/23 02/06/23 Yes [provider]  gabapentin (NEURONTIN) 300 MG capsule Take by mouth. 01/10/23 01/10/24 Yes [provider]  lisinopril-hydrochlorothiazide (ZESTORETIC) 20-12.5 MG tablet TAKE 2 TABLETS BY MOUTH IN THE  MORNING 08/07/22  Yes Sherlene Shams, MD  Multiple Vitamins-Minerals (VITAMIN D3 COMPLETE PO) Take 2,000 Units by mouth daily.   Yes [provider]  Omega-3 Fatty Acids (FISH OIL PO) Take by mouth.   Yes [provider]  pravastatin (PRAVACHOL) 40 MG tablet TAKE 1 TABLET BY MOUTH AT  BEDTIME 12/14/22  Yes Sherlene Shams, MD  venlafaxine XR (EFFEXOR-XR) 150 MG 24 hr capsule TAKE 1 CAPSULE BY MOUTH DAILY  WITH BREAKFAST 11/10/22  Yes Sherlene Shams, MD  desloratadine (CLARINEX) 5 MG tablet Take 1 tablet (5 mg total) by mouth daily. 04/19/22   Glori Luis, MD  Misc Natural Products (JOINT HEALTH PO) Take by mouth.    [provider]  Multiple Vitamin (MULTI-VITAMIN DAILY PO) Multi Vitamin    [provider]    Family History Family History  Problem Relation Age of Onset   Cancer Mother        colon cancer   Cancer Sister        breast cancer    Breast cancer Sister    Cancer Cousin        m 1st cousin breast and pancreatic ca   Breast cancer Cousin    Cancer Cousin        breast m 1st cousin   Breast cancer Cousin    Cancer Cousin        m 1st cousin breast    Breast cancer Cousin    Cancer Cousin        m 1st cousin breast    Breast cancer Cousin     Social History Social History   Tobacco Use   Smoking status: Former    Current packs/day: 0.00    Average packs/day: 0.3 packs/day for 3.0 years (0.8 ttl pk-yrs)    Types: Cigarettes    Start date: 33    Quit date: 1970    Years since quitting: 54.5    Smokeless tobacco: Never   Tobacco comments:    former smoker x 3 years 19-22 light   Vaping Use   Vaping status: Never Used  Substance Use Topics   Alcohol use: Yes    Alcohol/week: 1.0 standard drink of alcohol    Types: 1 Glasses of wine per week    Comment: occ   Drug use: Never     Allergies  Patient has no known allergies.   Review of Systems Review of Systems  See HPI Physical Exam Triage Vital Signs ED Triage Vitals [02/03/23 1246]  Encounter Vitals Group     BP (!) 150/96     Systolic BP Percentile      Diastolic BP Percentile      Pulse Rate 95     Resp 16     Temp 98.2 F (36.8 C)     Temp Source Oral     SpO2 97 %     Weight      Height      Head Circumference      Peak Flow      Pain Score 0     Pain Loc      Pain Education      Exclude from Growth Chart    No data found.  Updated Vital Signs BP (!) 150/96 (BP Location: Right Arm)   Pulse 95   Temp 98.2 F (36.8 C) (Oral)   Resp 16   SpO2 97%      Physical Exam Constitutional:      General: She is not in acute distress.    Appearance: She is well-developed.  HENT:     Head: Normocephalic and atraumatic.  Eyes:     Pupils: Pupils are equal, round, and reactive to light.     Comments: Right eye has mild conjunctival injection.  With eversion of the lower lid there is an erythematous nodule  Cardiovascular:     Rate and Rhythm: Normal rate.  Pulmonary:     Effort: Pulmonary effort is normal. No respiratory distress.  Abdominal:     General: There is no distension.     Palpations: Abdomen is soft.  Musculoskeletal:        General: Normal range of motion.     Cervical back: Normal range of motion.  Skin:    General: Skin is warm and dry.  Neurological:     Mental Status: She is alert.      UC Treatments / Results  Labs (all labs ordered are listed, but only abnormal results are displayed) Labs Reviewed - No data to display  EKG   Radiology No results  found.  Procedures Procedures (including critical care time)  Medications Ordered in UC Medications  erythromycin ophthalmic ointment (1 Application Left Eye Given 02/03/23 1312)    Initial Impression / Assessment and Plan / UC Course  I have reviewed the triage vital signs and the nursing notes.  Pertinent labs & imaging results that were available during my care of the patient were reviewed by me and considered in my medical decision making (see chart for details).     Final Clinical Impressions(s) / UC Diagnoses   Final diagnoses:  Hordeolum internum of left lower eyelid     Discharge Instructions      Apply eye ointment 3 x a day Warm washcloth compress 3 x a day for a few min See your eye doctor if not better by next week   ED Prescriptions   None    PDMP not reviewed this encounter.   Eustace Moore, MD 02/03/23 [phone]

## 2023-02-03 NOTE — Discharge Instructions (Signed)
Apply eye ointment 3 x a day Warm washcloth compress 3 x a day for a few min See your eye doctor if not better by next week

## 2023-02-03 NOTE — ED Triage Notes (Signed)
Lump underneath right eye appearing today. Most evident when pulling the lower lid down. Patient states its mildly sensitive to touch but not painful otherwise. Denies any gross changes to vision, but that the pressure on the eye is uncomfortable.

## 2023-04-19 IMAGING — CR DG NECK SOFT TISSUE
1 series · 1 of 1 positions shown · non-contrast
Comparison: None.

CLINICAL DATA: Hypoglossal nerve stimulator placement.

EXAM:
NECK SOFT TISSUES - 1+ VIEW

[c-spine lat]
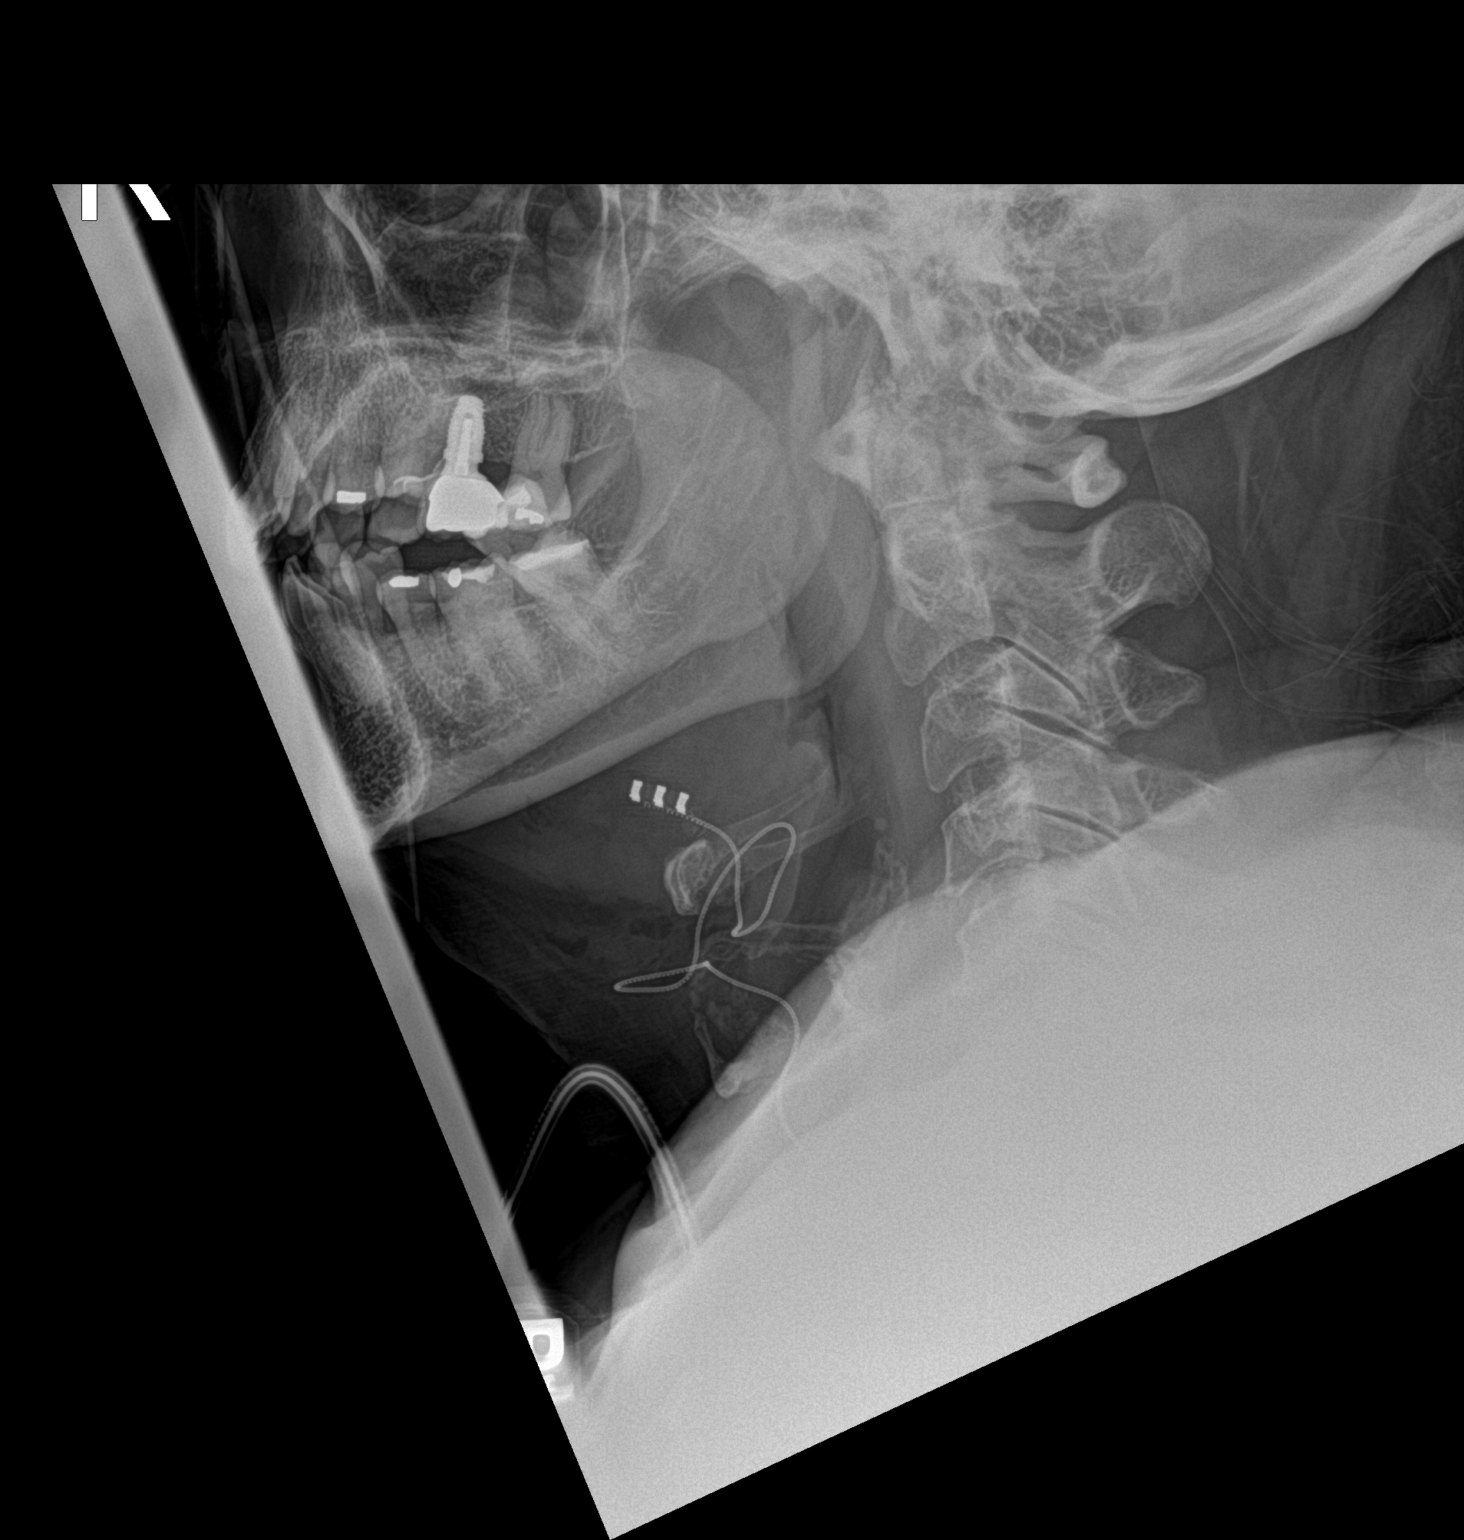

[1 of 1 positions shown; findings below may reference images not displayed]

FINDINGS: Hypoglossal nerve stimulator lead noted in the base of tongue
region. Small foci of postsurgical subcutaneous emphysema in the
area.

There is no evidence of retropharyngeal soft tissue swelling or
epiglottic enlargement. The cervical airway is unremarkable and no
radio-opaque foreign body identified.
IMPRESSION: 1. Hypoglossal nerve stimulator lead in the base of tongue region
without complicating feature.

## 2023-05-02 ENCOUNTER — Ambulatory Visit
Admission: RE | Admit: 2023-05-02 | Discharge: 2023-05-02 | Disposition: A | Discharge status: Home / Self Care | Payer: Medicare Other | Source: Ambulatory Visit | Attending: Radiation Oncology | Admitting: Radiation Oncology

## 2023-05-02 DIAGNOSIS — Z1231 Encounter for screening mammogram for malignant neoplasm of breast: Secondary | ICD-10-CM | POA: Insufficient documentation

## 2023-05-07 ENCOUNTER — Ambulatory Visit: Payer: Medicare Other | Admitting: Radiation Oncology

## 2023-05-09 ENCOUNTER — Encounter: Payer: Self-pay | Admitting: Radiation Oncology

## 2023-05-09 ENCOUNTER — Ambulatory Visit
Admission: RE | Admit: 2023-05-09 | Discharge: 2023-05-09 | Disposition: A | Discharge status: Home / Self Care | Payer: Medicare Other | Source: Ambulatory Visit | Attending: Radiation Oncology | Admitting: Radiation Oncology

## 2023-05-09 VITALS — BP 156/88 | HR 69 | Temp 98.6°F | Resp 16 | Wt 182.0 lb

## 2023-05-09 DIAGNOSIS — Z923 Personal history of irradiation: Secondary | ICD-10-CM | POA: Diagnosis not present

## 2023-05-09 DIAGNOSIS — D0512 Intraductal carcinoma in situ of left breast: Secondary | ICD-10-CM

## 2023-05-09 DIAGNOSIS — Z86000 Personal history of in-situ neoplasm of breast: Secondary | ICD-10-CM | POA: Insufficient documentation

## 2023-05-09 NOTE — Progress Notes (Signed)
Radiation Oncology Follow up Note  Name: Tiffany Jarvis   Date:   05/09/2023 MRN:  [id number] DOB: [birth year]    This 72 y.o. 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  I am just following her clinically.  She is without complaint.  She recently had a mammogram.  Which I have reviewed showing no evidence of malignancy.  She does have dense breasts which may obscure small masses.  COMPLICATIONS OF TREATMENT: none  FOLLOW UP COMPLIANCE: keeps appointments   PHYSICAL EXAM:  BP (!) 156/88   Pulse 69   Temp 98.6 F (37 C) (Tympanic)   Resp 16   Wt 182 lb (82.6 kg)   BMI 31.73 kg/m  Patient is had bilateral breast surgery.  No dominant masses noted in either breast no axillary supraclavicular adenopathy is identified.  Well-developed well-nourished patient in NAD. HEENT reveals PERLA, EOMI, discs not visualized.  Oral cavity is clear. No oral mucosal lesions are identified. Neck is clear without evidence of cervical or supraclavicular adenopathy. Lungs are clear to A&P. Cardiac examination is essentially unremarkable with regular rate and rhythm without murmur rub or thrill. Abdomen is benign with no organomegaly or masses noted. Motor sensory and DTR levels are equal and symmetric in the upper and lower extremities. Cranial nerves II through XII are grossly intact. Proprioception is intact. No peripheral adenopathy or edema is identified. No motor or sensory levels are noted. Crude visual fields are within normal range.  RADIOLOGY RESULTS: Mammograms reviewed compatible with above-stated findings  PLAN: At this time patient is now out over 4 years.  I have asked her GP to continue following her with yearly breast  examinations as well as yearly mammograms.  I be happy to reevaluate her or see her in consultation should the need arise.  Patient knows to call with any concerns.  I would like to take this opportunity to thank you for allowing me to participate in the care of your patient.Carmina Miller, MD

## 2023-05-30 NOTE — Telephone Encounter (Signed)
Error

## 2023-08-04 IMAGING — MG MM DIGITAL SCREENING UNILAT*R* W/ TOMO W/ CAD
6 series · 6 of 18 positions shown · non-contrast
Comparison: Previous exam(s).

CLINICAL DATA: Screening.

EXAM:
DIGITAL SCREENING UNILATERAL RIGHT MAMMOGRAM WITH CAD AND
TOMOSYNTHESIS
TECHNIQUE: Right screening digital craniocaudal and mediolateral oblique
mammograms were obtained. Right screening digital breast
tomosynthesis was performed. The images were evaluated with
computer-aided detection.

[R MLO synth-2D (1 of 2)]
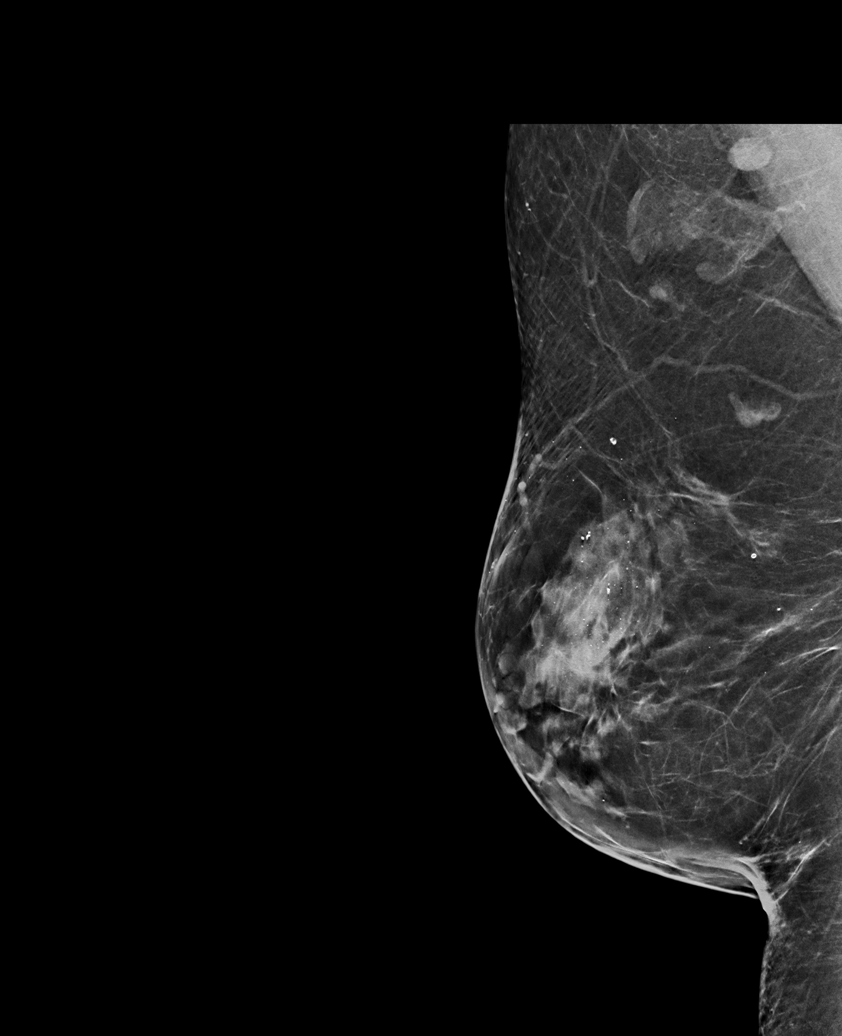

[R CC synth-2D]
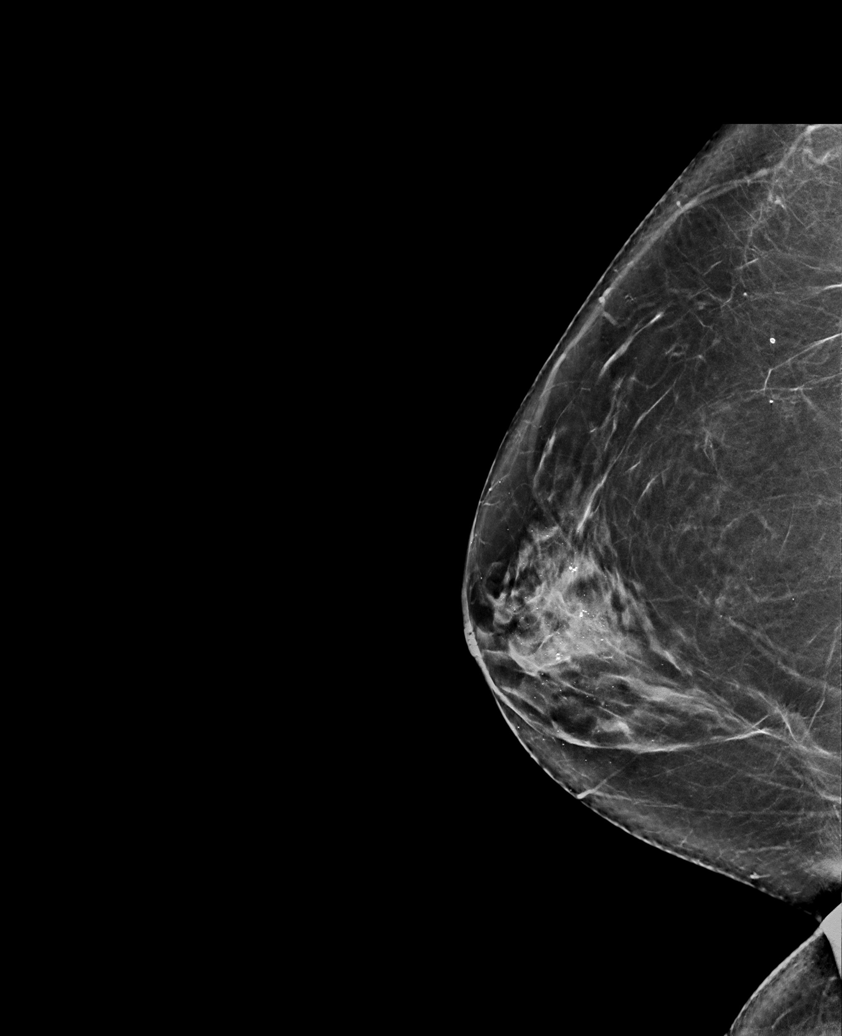

[R MLO synth-2D (2 of 2)]
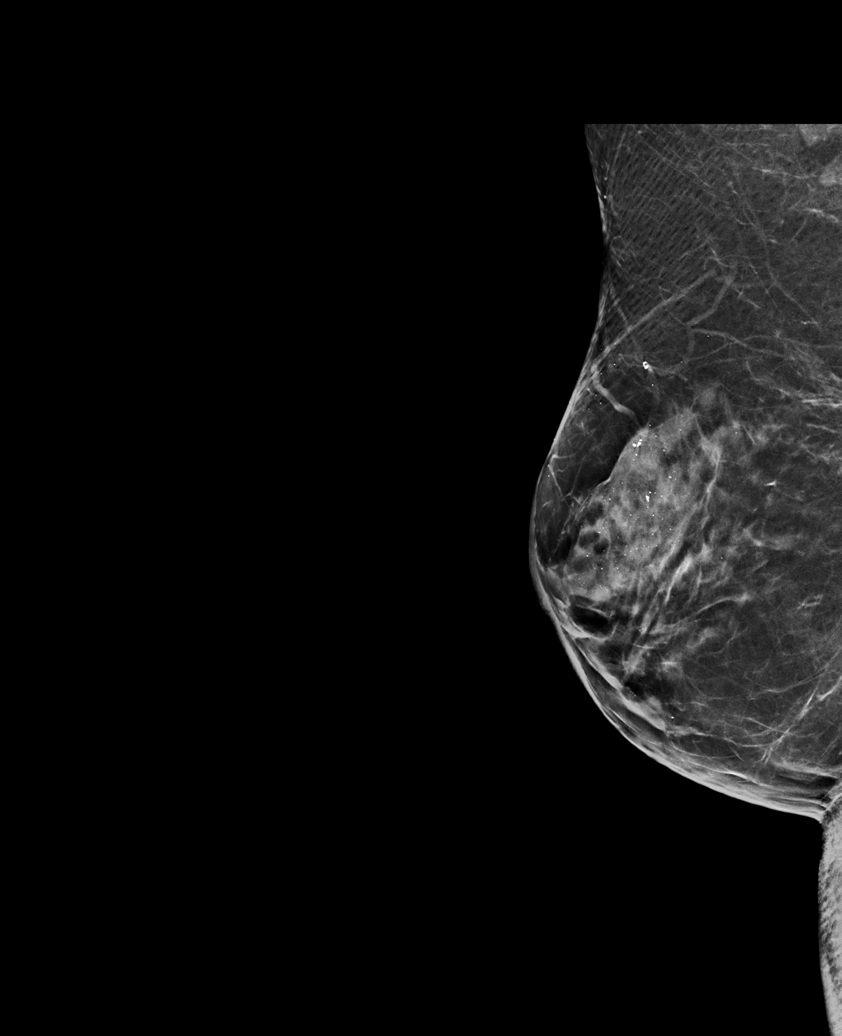

[R MLO tomo (1 of 2) · tomo slice 41/82.0]
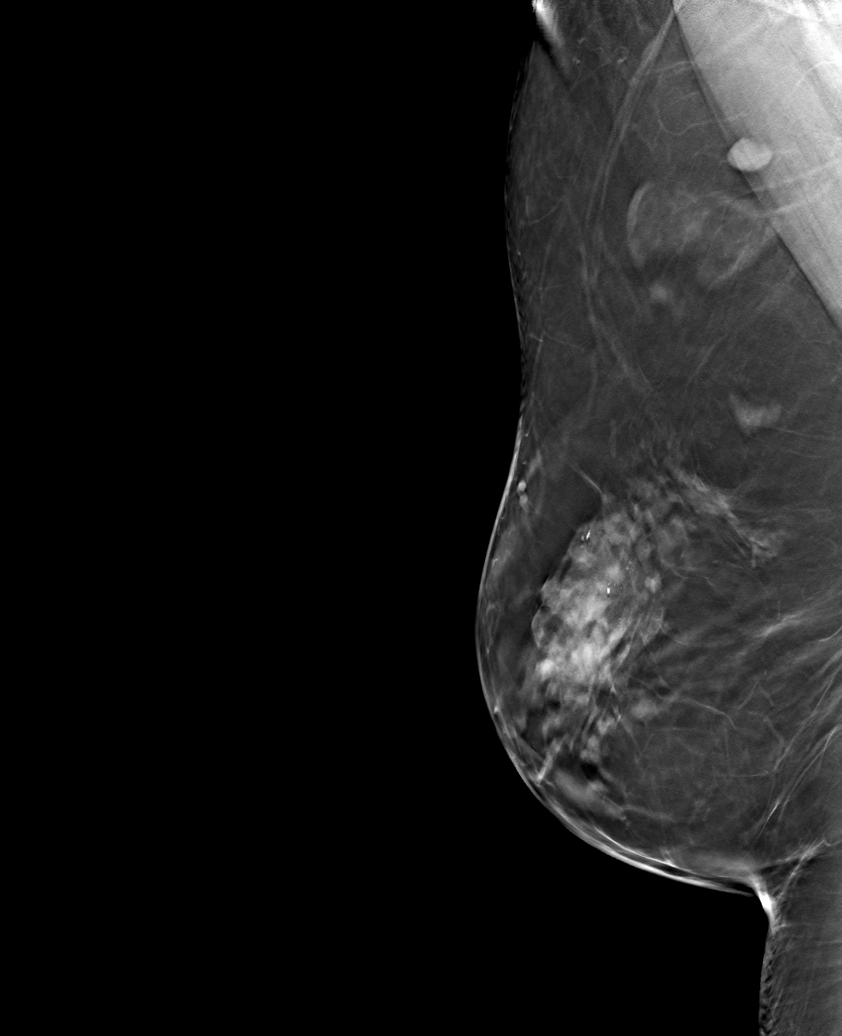

[R CC tomo · tomo slice 37/73.0]
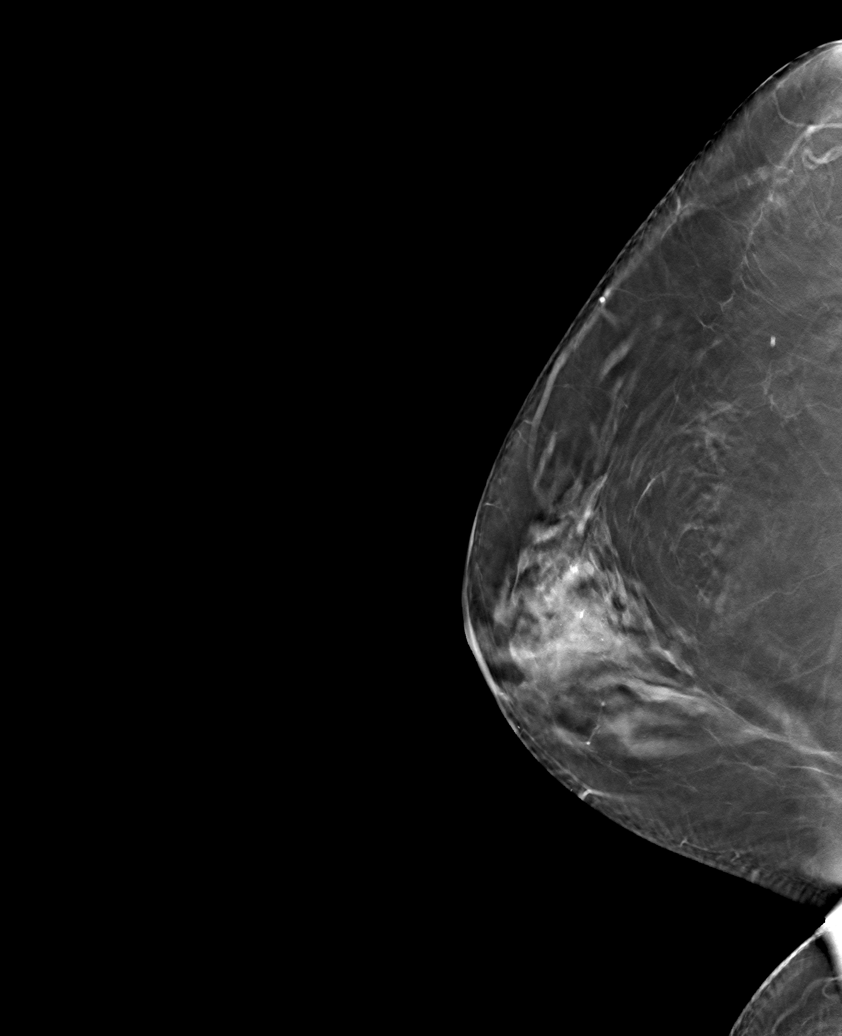

[R MLO tomo (2 of 2) · tomo slice 33/66.0]
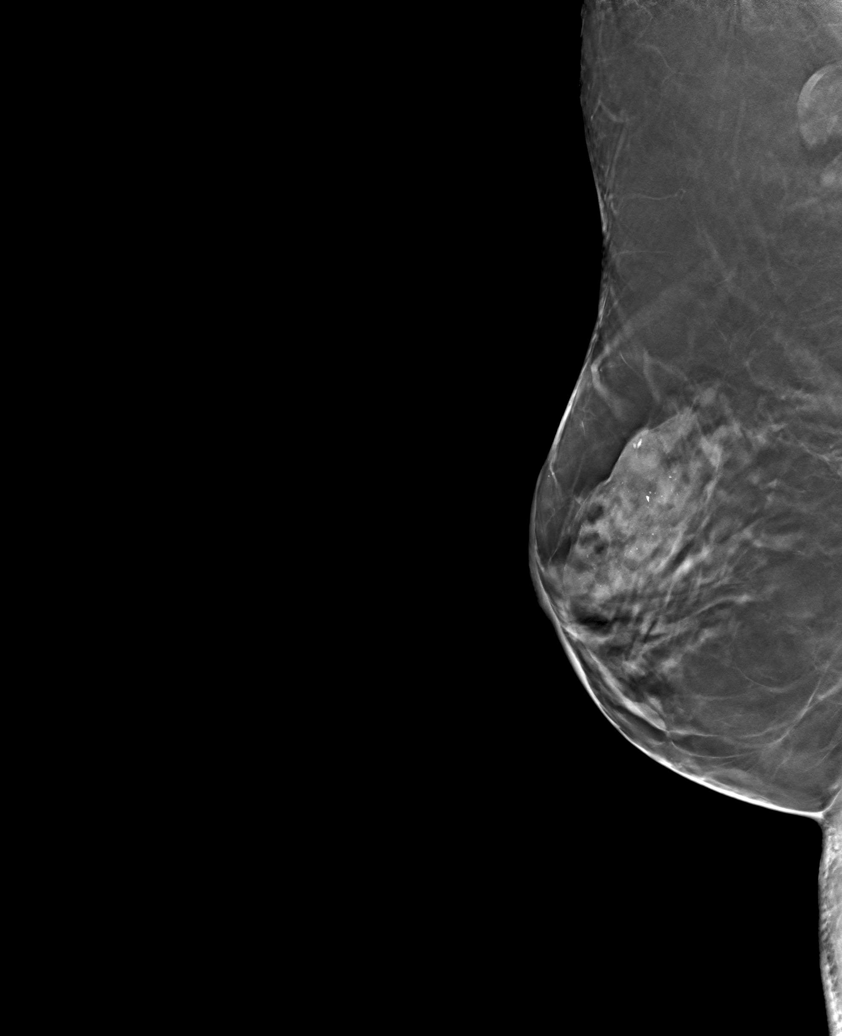

[6 of 18 positions shown; findings below may reference images not displayed]

ACR Breast Density Category b: There are scattered areas of
fibroglandular density.
FINDINGS: The patient has had a left mastectomy. There are no findings
suspicious for malignancy.
IMPRESSION: No mammographic evidence of malignancy. A result letter of this
screening mammogram will be mailed directly to the patient.

RECOMMENDATION:
Screening mammogram in one year.  (Code:NT-E-EGT)

BI-RADS CATEGORY  1: Negative.

## 2023-08-11 ENCOUNTER — Other Ambulatory Visit: Payer: Self-pay | Admitting: Internal Medicine

## 2023-08-11 DIAGNOSIS — I10 Essential (primary) hypertension: Secondary | ICD-10-CM

## 2023-08-24 ENCOUNTER — Encounter: Payer: Self-pay | Admitting: Internal Medicine

## 2023-08-24 DIAGNOSIS — F32A Depression, unspecified: Secondary | ICD-10-CM

## 2023-08-29 ENCOUNTER — Encounter: Payer: Self-pay | Admitting: Internal Medicine

## 2023-08-29 ENCOUNTER — Telehealth: Payer: Medicare Other | Admitting: Internal Medicine

## 2023-08-29 ENCOUNTER — Other Ambulatory Visit: Payer: Self-pay | Admitting: Internal Medicine

## 2023-08-29 VITALS — Ht 63.5 in | Wt 175.0 lb

## 2023-08-29 DIAGNOSIS — F419 Anxiety disorder, unspecified: Secondary | ICD-10-CM

## 2023-08-29 DIAGNOSIS — F32A Depression, unspecified: Secondary | ICD-10-CM | POA: Diagnosis not present

## 2023-08-29 DIAGNOSIS — R232 Flushing: Secondary | ICD-10-CM | POA: Diagnosis not present

## 2023-08-29 DIAGNOSIS — I10 Essential (primary) hypertension: Secondary | ICD-10-CM

## 2023-08-29 DIAGNOSIS — E7800 Pure hypercholesterolemia, unspecified: Secondary | ICD-10-CM

## 2023-08-29 DIAGNOSIS — K760 Fatty (change of) liver, not elsewhere classified: Secondary | ICD-10-CM

## 2023-08-29 DIAGNOSIS — R7309 Other abnormal glucose: Secondary | ICD-10-CM

## 2023-08-29 MED ORDER — ARIPIPRAZOLE 2 MG PO TABS: 1.0000 mg | ORAL_TABLET | Freq: Every day | ORAL | 2 refills | Status: DC

## 2023-08-29 MED ORDER — VENLAFAXINE HCL ER 150 MG PO CP24: 150.0000 mg | ORAL_CAPSULE | Freq: Every day | ORAL | 3 refills | Status: DC

## 2023-08-29 NOTE — Progress Notes (Signed)
Virtual Visit via Caregility   Note   This format is felt to be most appropriate for this patient at this time.  All issues noted in this document were discussed and addressed.  No physical exam was performed (except for noted visual exam findings with Video Visits).   I connected with Teena on 08/29/23 at  5:00 PM EST by a video enabled telemedicine application or telephone and verified that I am speaking with the correct person using two identifiers. Location patient: home Location provider: work or home office Persons participating in the virtual visit: patient, provider  I discussed the limitations, risks, security and privacy concerns of performing an evaluation and management service by telephone and the availability of in person appointments. I also discussed with the patient that there may be a patient responsible charge related to this service. The patient expressed understanding and agreed to proceed.   Reason for visit: depression   HPI:  Post election anxiety/depression:  Tiffany Jarvis endorses feelings of hopeless, helplessness and despair following the election of [employer]. She is not sleeping well, having nightmares about Trump.  Symptoms are aggravated by loneliness . Move here from [place] 4 years ago for husband's career;  she retired from teaching 4 yrs ago.  He lost his job a year ago.  Their retirement plans have essential been upheaved and upset .  Her 3 grown children are supportive but live in [place], [place] and [place] so she doesn't see them much.  Taking Effexor.  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  01/19/2023 right L4-5 and L5-S1 TFESI (100% ongoing pain relief )  Now having left shoulder pain ,  getting PT first visit tomorrow      ROS: See pertinent positives and negatives per HPI.  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    Colon polyps    Depression    History of chicken pox    Hyperlipidemia    Hypertension    Influenza due to influenza virus, type B 08/25/2018   OSA (obstructive sleep apnea)    not able to tolerate cpap or dental mouth piece    Spinal stenosis    Vertigo    CT neck and head negative significant CAS (1 episode - "yrs ago")    Past Surgical History:  Procedure Laterality Date   BREAST BIOPSY Left 2003   positive   BREAST SURGERY     L mastectomy ~2001/2002 and reconstruction   CATARACT EXTRACTION W/PHACO Left 01/28/2020   Procedure: CATARACT EXTRACTION PHACO AND INTRAOCULAR LENS PLACEMENT (IOC) LEFT;  Surgeon: Lockie Mola, MD;  Location: [facility];  Service: Ophthalmology;  Laterality: Left;  5.61 0:53.0 10.6%   CATARACT EXTRACTION W/PHACO Right 03/03/2020   Procedure: CATARACT EXTRACTION PHACO AND INTRAOCULAR LENS PLACEMENT (IOC) RIGHT;  Surgeon: Lockie Mola, MD;  Location: [facility];  Service: Ophthalmology;  Laterality: Right;  2.52 0:41.6 6.0%   CESAREAN SECTION     x 3 1979, 1982, 1992    COLONOSCOPY     COLONOSCOPY WITH PROPOFOL N/A 04/19/2020   Procedure: COLONOSCOPY WITH BIOPSY;  Surgeon: Midge Minium, MD;  Location: [facility];  Service: Endoscopy;  Laterality: N/A;  priority 4   DRUG INDUCED ENDOSCOPY N/A 09/22/2020  Procedure: DRUG INDUCED ENDOSCOPY;  Surgeon: Christia Reading, MD;  Location: [facility];  Service: ENT;  Laterality: N/A;   EYE SURGERY Left 07/2019   retina hole surgery   IMPLANTATION OF HYPOGLOSSAL NERVE STIMULATOR Right 12/15/2020   Procedure: IMPLANTATION OF HYPOGLOSSAL NERVE STIMULATOR;  Surgeon: Christia Reading, MD;  Location: [facility];  Service: ENT;  Laterality: Right;   MASTECTOMY Left 2003   OVARIAN CYST REMOVAL Right    POLYPECTOMY N/A 04/19/2020   Procedure: POLYPECTOMY;  Surgeon: Midge Minium, MD;  Location: [facility];  Service:  Endoscopy;  Laterality: N/A;   REDUCTION MAMMAPLASTY Right    TONSILLECTOMY     TRIGGER FINGER RELEASE      Family History  Problem Relation Age of Onset   Cancer Mother        colon cancer   Cancer Sister        breast cancer    Breast cancer Sister    Cancer Cousin        m 1st cousin breast and pancreatic ca   Breast cancer Cousin    Cancer Cousin        breast m 1st cousin   Breast cancer Cousin    Cancer Cousin        m 1st cousin breast    Breast cancer Cousin    Cancer Cousin        m 1st cousin breast    Breast cancer Cousin     SOCIAL HX:  reports that she quit smoking about 55 years ago. Her smoking use included cigarettes. She started smoking about 58 years ago. She has a 0.8 pack-year smoking history. She has never used smokeless tobacco. She reports current alcohol use of about 1.0 standard drink of alcohol per week. She reports that she does not use drugs.    Current Outpatient Medications:    amLODipine (NORVASC) 2.5 MG tablet, TAKE 1 TABLET BY MOUTH DAILY, Disp: 90 tablet, Rfl: 3   ARIPiprazole (ABILIFY) 2 MG tablet, Take 0.5 tablets (1 mg total) by mouth daily., Disp: 15 tablet, Rfl: 2   Calcium Carbonate (CALCIUM 600 PO), Take 1 tablet by mouth daily., Disp: , Rfl:    lisinopril-hydrochlorothiazide (ZESTORETIC) 20-12.5 MG tablet, TAKE 2 TABLETS BY MOUTH IN THE  MORNING, Disp: 180 tablet, Rfl: 0   Misc Natural Products (JOINT HEALTH PO), Take by mouth., Disp: , Rfl:    Multiple Vitamin (MULTI-VITAMIN DAILY PO), Multi Vitamin, Disp: , Rfl:    Multiple Vitamins-Minerals (VITAMIN D3 COMPLETE PO), Take 2,000 Units by mouth daily., Disp: , Rfl:    Omega-3 Fatty Acids (FISH OIL PO), Take by mouth., Disp: , Rfl:    pravastatin (PRAVACHOL) 40 MG tablet, TAKE 1 TABLET BY MOUTH AT  BEDTIME, Disp: 90 tablet, Rfl: 3   venlafaxine XR (EFFEXOR-XR) 150 MG 24 hr capsule, Take 1 capsule (150 mg total) by mouth daily with breakfast., Disp: 90 capsule, Rfl: 3  EXAM:  VITALS  per patient if applicable:  GENERAL: alert, oriented, appears well and in no acute distress  HEENT: atraumatic, conjunttiva clear, no obvious abnormalities on inspection of external nose and ears  NECK: normal movements of the head and neck  LUNGS: on inspection no signs of respiratory distress, breathing rate appears normal, no obvious gross SOB, gasping or wheezing  CV: no obvious cyanosis  MS: moves all visible extremities without noticeable abnormality  PSYCH/NEURO: pleasant and cooperative, no obvious depression or anxiety, speech and thought processing  grossly intact  ASSESSMENT AND PLAN: Anxiety and depression Assessment & Plan: Secondary to multiple disappointments, including difficulty acclimating to [place] culture,  recent election results.  And alienation from friends and children   adding abilify to effexor.  Referral to geriatric psychologist in [place] has been done;  will refer to Dr Maryruth Bun if needed    Hot flashes -     Venlafaxine HCl ER; Take 1 capsule (150 mg total) by mouth daily with breakfast.  Dispense: 90 capsule; Refill: 3  Other orders -     ARIPiprazole; Take 0.5 tablets (1 mg total) by mouth daily.  Dispense: 15 tablet; Refill: 2      I discussed the assessment and treatment plan with the patient. The patient was provided an opportunity to ask questions and all were answered. The patient agreed with the plan and demonstrated an understanding of the instructions.   The patient was advised to call back or seek an in-person evaluation if the symptoms worsen or if the condition fails to improve as anticipated.   I spent 30 minutes dedicated to the care of this patient on the date of this encounter to include pre-visit review of his medical history,  Face-to-face time with the patient , and post visit ordering of testing and therapeutics.    Sherlene Shams, MD

## 2023-08-29 NOTE — Assessment & Plan Note (Signed)
Secondary to multiple disappointments, including difficulty acclimating to [facility] culture,  recent election results.  And alienation from friends and children   adding abilify to effexor.  Referral to geriatric psychologist in [place] has been done;  will refer to Dr Maryruth Bun if needed

## 2023-09-18 NOTE — Addendum Note (Signed)
 Addended by: Sherlene Shams on: 09/18/2023 09:13 AM   Modules accepted: Orders

## 2023-09-19 NOTE — Addendum Note (Signed)
 Addended by: Sherlene Shams on: 09/19/2023 10:18 PM   Modules accepted: Orders

## 2023-09-21 ENCOUNTER — Other Ambulatory Visit: Payer: Self-pay | Admitting: Internal Medicine

## 2023-09-21 ENCOUNTER — Telehealth: Payer: Self-pay

## 2023-09-21 NOTE — Telephone Encounter (Signed)
 Copied from CRM [id number]. Topic: Clinical - Prescription Issue >> Sep 21, 2023 11:02 AM Isabell A wrote: Reason for CRM: Patient states ARIPiprazole (ABILIFY) 2 MG tablet has been causing weight gain - she is wanting to come off the drug but states theres a certain way to do it. Patient requesting a call back from Dr.Tullo, she has 3 pills left and she takes 1/2 daily, unsure if this is enough for her to come off.

## 2023-09-21 NOTE — Telephone Encounter (Signed)
 Pt.notified

## 2023-10-05 ENCOUNTER — Other Ambulatory Visit: Payer: Self-pay

## 2023-10-05 DIAGNOSIS — R232 Flushing: Secondary | ICD-10-CM

## 2023-10-05 MED ORDER — VENLAFAXINE HCL ER 150 MG PO CP24: 150.0000 mg | ORAL_CAPSULE | Freq: Every day | ORAL | 1 refills | Status: DC

## 2023-10-11 ENCOUNTER — Ambulatory Visit (HOSPITAL_COMMUNITY): Payer: Self-pay | Admitting: Psychiatry

## 2023-10-13 ENCOUNTER — Other Ambulatory Visit: Payer: Self-pay | Admitting: Internal Medicine

## 2023-10-13 DIAGNOSIS — I10 Essential (primary) hypertension: Secondary | ICD-10-CM

## 2023-10-30 ENCOUNTER — Ambulatory Visit (HOSPITAL_COMMUNITY): Payer: Self-pay | Admitting: Licensed Clinical Social Worker

## 2023-11-08 ENCOUNTER — Ambulatory Visit: Admitting: Internal Medicine

## 2023-11-08 ENCOUNTER — Encounter: Payer: Self-pay | Admitting: Internal Medicine

## 2023-11-08 VITALS — BP 136/80 | HR 76 | Ht 63.5 in | Wt 179.4 lb

## 2023-11-08 DIAGNOSIS — Z0000 Encounter for general adult medical examination without abnormal findings: Secondary | ICD-10-CM | POA: Diagnosis not present

## 2023-11-08 DIAGNOSIS — R7309 Other abnormal glucose: Secondary | ICD-10-CM | POA: Diagnosis not present

## 2023-11-08 DIAGNOSIS — I10 Essential (primary) hypertension: Secondary | ICD-10-CM

## 2023-11-08 DIAGNOSIS — E7800 Pure hypercholesterolemia, unspecified: Secondary | ICD-10-CM

## 2023-11-08 DIAGNOSIS — F419 Anxiety disorder, unspecified: Secondary | ICD-10-CM

## 2023-11-08 DIAGNOSIS — Z86000 Personal history of in-situ neoplasm of breast: Secondary | ICD-10-CM | POA: Diagnosis not present

## 2023-11-08 DIAGNOSIS — E785 Hyperlipidemia, unspecified: Secondary | ICD-10-CM | POA: Diagnosis not present

## 2023-11-08 DIAGNOSIS — F32A Depression, unspecified: Secondary | ICD-10-CM

## 2023-11-08 DIAGNOSIS — D0512 Intraductal carcinoma in situ of left breast: Secondary | ICD-10-CM

## 2023-11-08 DIAGNOSIS — Z1231 Encounter for screening mammogram for malignant neoplasm of breast: Secondary | ICD-10-CM

## 2023-11-08 DIAGNOSIS — E669 Obesity, unspecified: Secondary | ICD-10-CM | POA: Diagnosis not present

## 2023-11-08 LAB — COMPREHENSIVE METABOLIC PANEL WITH GFR
ALT: 22 U/L (ref 0–35)
AST: 23 U/L (ref 0–37)
Albumin: 4.5 g/dL (ref 3.5–5.2)
Alkaline Phosphatase: 50 U/L (ref 39–117)
BUN: 12 mg/dL (ref 6–23)
CO2: 32 meq/L (ref 19–32)
Calcium: 9.8 mg/dL (ref 8.4–10.5)
Chloride: 99 meq/L (ref 96–112)
Creatinine, Ser: 0.61 mg/dL (ref 0.40–1.20)
GFR: 90.61 mL/min (ref >60.00)
Glucose, Bld: 101 mg/dL — ABNORMAL HIGH (ref 70–99)
Potassium: 4 meq/L (ref 3.5–5.1)
Sodium: 139 meq/L (ref 135–145)
Total Bilirubin: 0.8 mg/dL (ref 0.2–1.2)
Total Protein: 6.9 g/dL (ref 6.0–8.3)

## 2023-11-08 LAB — TSH: TSH: 1.47 u[IU]/mL (ref 0.35–5.50)

## 2023-11-08 LAB — HEMOGLOBIN A1C: Hgb A1c MFr Bld: 5.9 % (ref 4.6–6.5)

## 2023-11-08 MED ORDER — PRAVASTATIN SODIUM 40 MG PO TABS: 40.0000 mg | ORAL_TABLET | Freq: Every day | ORAL | 3 refills | Status: DC

## 2023-11-08 MED ORDER — AMLODIPINE BESYLATE 5 MG PO TABS: 5.0000 mg | ORAL_TABLET | Freq: Every day | ORAL | 1 refills | Status: DC

## 2023-11-08 NOTE — Assessment & Plan Note (Signed)
 Annual screening mammograms of right breast are done every September by Dr Glenis Langdon alternating with breast MRI

## 2023-11-08 NOTE — Assessment & Plan Note (Signed)
 I have encouraged her to address the inability to lose weight with portion reduction, a low glycemic index diet and continued regular exercise a minimum of 5 days per week.

## 2023-11-08 NOTE — Patient Instructions (Addendum)
 GOOD TO SEE YOU!    I have increased your amlodipine  dose to 5 mg daily to get your BP below 130/80  I HAVE ORDERED YOUR UNILATERAL (ONE SIDED) MAMMOGRAM WHICH IS DUE ON OR AFTER OCT 18. please  call [facility] to make your appointment [phone]     I do recommend the RSV vaccine for you, .  It is now available at your pharmacy  and will protect you against the Respiratory Syncytial Virus   PORTION SIZE IS THE KEY TO WEIGHT LOSS SINCE YOU ARE ALREADY EXERCISING REGULARLY!

## 2023-11-08 NOTE — Assessment & Plan Note (Signed)
 Not at goal on current regimen of amlodipine  2.5 mg  Lisinopril /hctz.  Increase amlodipine  to 5 mg daily . Renal function stable, no changes today.  Lab Results  Component Value Date   CREATININE 0.61 11/08/2023   Lab Results  Component Value Date   NA 139 11/08/2023   K 4.0 11/08/2023   CL 99 11/08/2023   CO2 32 11/08/2023

## 2023-11-08 NOTE — Assessment & Plan Note (Signed)
 Managed with pravastatin .  LFTs are normal   Lab Results  Component Value Date   CHOL 203 (H) 11/07/2022   HDL 72.10 11/07/2022   LDLCALC 104 (H) 11/07/2022   LDLDIRECT 109.0 11/07/2022   TRIG 134.0 11/07/2022   CHOLHDL 3 11/07/2022     Lab Results  Component Value Date   ALT 22 11/08/2023   AST 23 11/08/2023   ALKPHOS 50 11/08/2023   BILITOT 0.8 11/08/2023

## 2023-11-08 NOTE — Assessment & Plan Note (Signed)
 Secondary to multiple disappointments, including difficulty acclimating to [facility] culture,  recent election results.  And alienation from friends and children .  Did not tolerate addition of  abilify  to effexor  .  Has appt with Dr Tere Felts next week.Aaron Aas

## 2023-11-08 NOTE — Assessment & Plan Note (Signed)
 Annual screening mammograms of right breast are done every September by Dr Glenis Langdon and are now to be done through our office

## 2023-11-08 NOTE — Assessment & Plan Note (Signed)
Sleeping fine since receiving [employer] 2 years ago. Has deferred follow up sleep test

## 2023-11-08 NOTE — Progress Notes (Signed)
 Patient ID: Tiffany Jarvis, female    DOB: [birth year]  Age: 73 y.o. MRN: [id number]  The patient is here for annual preventive  examination and management of other chronic and acute problems.   The risk factors are reflected in the social Tiffany.  The roster of all physicians providing medical care to patient - is listed in the Snapshot section of the chart.  Activities of daily living:  The patient is 100% independent in all ADLs: dressing, toileting, feeding as well as independent mobility  Home safety : The patient has smoke detectors in the home. They wear seatbelts.  There are no firearms at home. There is no violence in the home.   There is no risks for hepatitis, STDs or HIV. There is no   Tiffany of blood transfusion. They have no travel Tiffany to infectious disease endemic areas of the world.  The patient has seen their dentist in the last six month. They have seen their eye doctor in the last year. They admit to slight hearing difficulty with regard to whispered voices and some television programs.  They have deferred audiologic testing in the last year.  They do not  have excessive sun exposure. Discussed the need for sun protection: hats, long sleeves and use of sunscreen if there is significant sun exposure.   Diet: the importance of a healthy diet is discussed. They do have a healthy diet.  The benefits of regular aerobic exercise were discussed. She walks 4 times per week ,  20 minutes.   Jarvis screen: there are no signs or vegative symptoms of Jarvis- irritability, change in appetite, anhedonia, sadness/tearfullness.  Cognitive assessment: the patient manages all their financial and personal affairs and is actively engaged. They could relate day,date,year and events; recalled 2/3 objects at 3 minutes; performed clock-face test normally.  The following portions of the patient's Tiffany were reviewed and updated as appropriate: allergies, current medications, past family  Tiffany, past medical Tiffany,  past surgical Tiffany, past social Tiffany  and problem list.  Visual acuity was not assessed per patient preference since she has regular follow up with her ophthalmologist. Hearing and body mass index were assessed and reviewed.   During the course of the visit the patient was educated and counseled about appropriate screening and preventive services including : fall prevention , diabetes screening, nutrition counseling, colorectal cancer screening, and recommended immunizations.    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 Diagnoses of Tiffany Jarvis, Tiffany Jarvis, unspecified Tiffany Jarvis type, Tiffany Jarvis, Abnormal glucose, Tiffany Jarvis, Screening mammogram for Jarvis cancer, Obesity (BMI 30-39.9), Tiffany Jarvis, Encounter for preventive health examination, and Tiffany Jarvis were also pertinent to this visit.   1) follow up on Jarvis:  Patient is taking her medications as prescribed and notes no adverse effects.  Home BP readings have been done about once per week and are  generally < 130/80 .  She is avoiding added salt in her diet and walking regularly about 3 times per week for exercise  .   2) Left shoulder pain: saw orthoedics, had delayed improvement after a subacromial injection; after 3 weeks the pain had resolved,  ROM had improved so she did not pursue PT.  Using motrin 400 mg prn ,  not daily . At 6 week follow up with orthopedics she was given follow up prn.   3) remains anxious and frustrated about the future of the [place]   since President Trump's reelection. She is scheduled to see Dr Tere Felts next week but wonders if there is any point.    She feels that failure to protest the deportation of illegal aliens is tantamount to condoning Hitler's use of concentration camps to exterminate the Jewish people.  She has been  referred  4) obesity:  exercising for one hour daily on a recumbent bike and frustrated at the weight gain,  but she is eating more since the Jarvis has improved.    Tiffany Tiffany Jarvis has a past medical Tiffany of Allergy, Arthritis, Cancer (HCC), Colon polyps, Jarvis, Tiffany of chicken pox, Tiffany Jarvis, Jarvis, Influenza due to influenza virus, type B (08/25/2018), OSA (obstructive sleep apnea), Spinal stenosis, and Vertigo.   She has a past surgical Tiffany that includes Cesarean section; Jarvis surgery; Mastectomy (Left, 2003); Jarvis biopsy (Left, 2003); Reduction mammaplasty (Right); Cataract extraction w/PHACO (Left, 01/28/2020); Trigger finger release; Tonsillectomy; Ovarian cyst removal (Right); Colonoscopy; Eye surgery (Left, 07/2019); Cataract extraction w/PHACO (Right, 03/03/2020); Colonoscopy with propofol  (N/A, 04/19/2020); polypectomy (N/A, 04/19/2020); Drug induced endoscopy (N/A, 09/22/2020); and Implantation of hypoglossal nerve stimulator (Right, 12/15/2020).   Her family Tiffany includes Jarvis cancer in her cousin, cousin, cousin, cousin, and sister; Cancer in her cousin, cousin, cousin, cousin, mother, and sister.She reports that she quit smoking about 55 years ago. Her smoking use included cigarettes. She started smoking about 58 years ago. She has a 0.8 pack-year smoking Tiffany. She has never used smokeless tobacco. She reports current alcohol use of about 1.0 standard drink of alcohol per week. She reports that she does not use drugs.  Outpatient Medications Prior to Visit  Medication Sig Dispense Refill   Calcium Carbonate (CALCIUM 600 PO) Take 1 tablet by mouth daily.     lisinopril -hydrochlorothiazide  (ZESTORETIC ) 20-12.5 MG tablet TAKE 2 TABLETS BY MOUTH IN THE  MORNING 180 tablet 1   Misc Natural Products (JOINT HEALTH PO) Take by mouth.     Multiple Vitamin (MULTI-VITAMIN DAILY PO) Multi Vitamin     Multiple Vitamins-Minerals (VITAMIN D3 COMPLETE PO) Take 2,000  Units by mouth daily.     Omega-3 Fatty Acids (FISH OIL PO) Take by mouth.     venlafaxine  XR (EFFEXOR -XR) 150 MG 24 hr capsule Take 1 capsule (150 mg total) by mouth daily with breakfast. 90 capsule 1   amLODipine  (NORVASC ) 2.5 MG tablet TAKE 1 TABLET BY MOUTH DAILY 90 tablet 3   pravastatin  (PRAVACHOL ) 40 MG tablet TAKE 1 TABLET BY MOUTH AT  BEDTIME 90 tablet 3   ARIPiprazole  (ABILIFY ) 2 MG tablet Take 0.5 tablets (1 mg total) by mouth daily. 15 tablet 2   No facility-administered medications prior to visit.    Review of Systems  Patient denies headache, fevers, malaise, unintentional weight loss, skin rash, eye pain, sinus congestion and sinus pain, sore throat, dysphagia,  hemoptysis , cough, dyspnea, wheezing, chest pain, palpitations, orthopnea, edema, abdominal pain, nausea, melena, diarrhea, constipation, flank pain, dysuria, hematuria, urinary  Frequency, nocturia, numbness, tingling, seizures,  Focal weakness, Loss of consciousness,  Tremor, insomnia, and suicidal ideation.     Objective:  BP 136/80   Pulse 76   Ht 5' 3.5" (1.613 m)   Wt 179 lb 6.4 oz (81.4 kg)   SpO2 96%   BMI 31.28 kg/m   Physical Exam Vitals reviewed.  Constitutional:      General: She is not in acute distress.    Appearance: Normal appearance. She is well-developed and normal weight. She is not ill-appearing, toxic-appearing or  diaphoretic.  HENT:     Head: Normocephalic.     Right Ear: Tympanic membrane, ear canal and external ear normal. There is no impacted cerumen.     Left Ear: Tympanic membrane, ear canal and external ear normal. There is no impacted cerumen.     Nose: Nose normal.     Mouth/Throat:     Mouth: Mucous membranes are moist.     Pharynx: Oropharynx is clear.  Eyes:     General: No scleral icterus.       Right eye: No discharge.        Left eye: No discharge.     Conjunctiva/sclera: Conjunctivae normal.     Pupils: Pupils are equal, round, and reactive to light.  Neck:      Thyroid : No thyromegaly.     Vascular: No carotid bruit or JVD.  Cardiovascular:     Rate and Rhythm: Normal rate and regular rhythm.     Heart sounds: Normal heart sounds.  Pulmonary:     Effort: Pulmonary effort is normal. No respiratory distress.     Breath sounds: Normal breath sounds.  Chest:  Breasts:    Breasts are symmetrical.     Right: Normal. No swelling, inverted nipple, mass, nipple discharge, skin change or tenderness.     Left: Normal. No swelling, inverted nipple, mass, nipple discharge, skin change or tenderness.  Abdominal:     General: Bowel sounds are normal.     Palpations: Abdomen is soft. There is no mass.     Tenderness: There is no abdominal tenderness. There is no guarding or rebound.  Musculoskeletal:        General: Normal range of motion.     Cervical back: Normal range of motion and neck supple.  Lymphadenopathy:     Cervical: No cervical adenopathy.     Upper Body:     Right upper body: No supraclavicular, axillary or pectoral adenopathy.     Left upper body: No supraclavicular, axillary or pectoral adenopathy.  Skin:    General: Skin is warm and dry.  Neurological:     General: No focal deficit present.     Mental Status: She is alert and oriented to person, place, and time. Mental status is at baseline.  Psychiatric:        Mood and Affect: Mood normal.        Behavior: Behavior normal.        Thought Content: Thought content normal.        Judgment: Judgment normal.    Assessment & Plan:  Tiffany Jarvis (DCIS) of left Jarvis Assessment & Plan: Annual screening mammograms of right Jarvis are done every September by Dr Glenis Langdon and are now to be done through our office    Tiffany Jarvis -     amLODIPine  Besylate; Take 1 tablet (5 mg total) by mouth daily.  Dispense: 90 tablet; Refill: 1  Tiffany Jarvis, unspecified Tiffany Jarvis type -     Pravastatin  Sodium; Take 1 tablet (40 mg total) by mouth at bedtime.   Dispense: 90 tablet; Refill: 3  Tiffany Jarvis Assessment & Plan: Managed with pravastatin .  LFTs are normal   Lab Results  Component Value Date   CHOL 203 (H) 11/07/2022   HDL 72.10 11/07/2022   LDLCALC 104 (H) 11/07/2022   LDLDIRECT 109.0 11/07/2022   TRIG 134.0 11/07/2022   CHOLHDL 3 11/07/2022     Lab Results  Component Value Date   ALT 22 11/08/2023  AST 23 11/08/2023   ALKPHOS 50 11/08/2023   BILITOT 0.8 11/08/2023      Orders: -     Lipid Panel w/reflex Direct LDL -     TSH  Abnormal glucose -     Hemoglobin A1c  Tiffany Jarvis Assessment & Plan: Not at goal on current regimen of amlodipine  2.5 mg  Lisinopril /hctz.  Increase amlodipine  to 5 mg daily . Renal function stable, no changes today.  Lab Results  Component Value Date   CREATININE 0.61 11/08/2023   Lab Results  Component Value Date   NA 139 11/08/2023   K 4.0 11/08/2023   CL 99 11/08/2023   CO2 32 11/08/2023     Orders: -     Comprehensive metabolic panel with GFR  Screening mammogram for Jarvis cancer -     Digital Screening Mammogram, Right; Future  Obesity (BMI 30-39.9) Assessment & Plan: I have encouraged her to address the inability to lose weight with portion reduction, a low glycemic index diet and continued regular exercise a minimum of 5 days per week.     Tiffany Jarvis Assessment & Plan: Annual screening mammograms of right Jarvis are done every September by Dr Glenis Langdon alternating with Jarvis MRI    Encounter for preventive health examination Assessment & Plan: .age appropriate education and counseling updated, referrals for preventative services and immunizations addressed, dietary and smoking counseling addressed, most recent labs reviewed.  I have personally reviewed and have noted:   1) the patient's medical and social Tiffany 2) The pt's use of alcohol, tobacco, and illicit drugs 3) The patient's  current medications and supplements 4) Functional ability including ADL's, fall risk, home safety risk, hearing and visual impairment 5) Diet and physical activities 6) Evidence for Jarvis or mood disorder 7) The patient's height, weight, and BMI have been recorded in the chart     I have made referrals, and provided counseling and education based on review of the above    Tiffany Jarvis Assessment & Plan: Secondary to multiple disappointments, including difficulty acclimating to [facility] culture,  recent election results.  And alienation from friends and children .  Did not tolerate addition of  abilify  to effexor  .  Has appt with Dr Tere Felts next week..         I provided 40 minutes of  face-to-face time during this encounter reviewing patient's current problems and past surgeries,  recent labs and imaging studies, providing counseling on the above mentioned problems , and coordination  of care .   Follow-up: Return in about 6 months (around 05/09/2024).   Tiffany Flax, MD

## 2023-11-08 NOTE — Assessment & Plan Note (Signed)

## 2023-11-09 LAB — LIPID PANEL W/REFLEX DIRECT LDL
Cholesterol: 221 mg/dL — ABNORMAL HIGH (ref <200)
HDL: 75 mg/dL (ref >=50)
LDL Cholesterol (Calc): 121 mg/dL — ABNORMAL HIGH
Non-HDL Cholesterol (Calc): 146 mg/dL — ABNORMAL HIGH (ref <130)
Total CHOL/HDL Ratio: 2.9 (calc) (ref <5.0)
Triglycerides: 134 mg/dL (ref <150)

## 2023-11-11 ENCOUNTER — Encounter: Payer: Self-pay | Admitting: Internal Medicine

## 2023-11-11 DIAGNOSIS — E7800 Pure hypercholesterolemia, unspecified: Secondary | ICD-10-CM

## 2023-11-12 ENCOUNTER — Ambulatory Visit: Admitting: Psychiatry

## 2023-11-12 ENCOUNTER — Encounter: Payer: Self-pay | Admitting: Psychiatry

## 2023-11-12 DIAGNOSIS — F3342 Major depressive disorder, recurrent, in full remission: Secondary | ICD-10-CM | POA: Insufficient documentation

## 2023-11-12 DIAGNOSIS — G4701 Insomnia due to medical condition: Secondary | ICD-10-CM | POA: Diagnosis not present

## 2023-11-12 DIAGNOSIS — F419 Anxiety disorder, unspecified: Secondary | ICD-10-CM

## 2023-11-12 MED ORDER — HYDROXYZINE HCL 25 MG PO TABS: 12.5000 mg | ORAL_TABLET | Freq: Two times a day (BID) | ORAL | 1 refills | Status: DC | PRN

## 2023-11-12 NOTE — Patient Instructions (Addendum)
 [email or web address].org  www.psychologytoday  Hydroxyzine Capsules or Tablets What is this medication? HYDROXYZINE (hye DROX i zeen) treats the symptoms of allergies and allergic reactions. It may also be used to treat anxiety or cause drowsiness before a procedure. It works by blocking histamine, a substance released by the body during an allergic reaction. It belongs to a group of medications called antihistamines. This medicine may be used for other purposes; ask your health care provider or pharmacist if you have questions. COMMON BRAND NAME(S): ANX, Atarax, Rezine, Vistaril What should I tell my care team before I take this medication? They need to know if you have any of these conditions: Glaucoma Heart disease Irregular heartbeat or rhythm Kidney disease Liver disease Lung or breathing disease, such as asthma Stomach or intestine problems Thyroid  disease Trouble passing urine An unusual or allergic reaction to hydroxyzine, other medications, foods, dyes or preservatives Pregnant or trying to get pregnant Breastfeeding How should I use this medication? Take this medication by mouth with a full glass of water . Take it as directed on the prescription label at the same time every day. You can take it with or without food. If it upsets your stomach, take it with food. Talk to your care team about the use of this medication in children. While it may be prescribed for children as young as 6 years for selected conditions, precautions do apply. People 65 years and older may have a stronger reaction and need a smaller dose. Overdosage: If you think you have taken too much of this medicine contact a poison control center or emergency room at once. NOTE: This medicine is only for you. Do not share this medicine with others. What if I miss a dose? If you miss a dose, take it as soon as you can. If it is almost time for your next dose, take only that dose. Do not take double or extra  doses. What may interact with this medication? Do not take this medication with any of the following: Cisapride Dronedarone Pimozide Thioridazine This medication may also interact with the following: Alcohol Antihistamines for allergy, cough, and cold Atropine Barbiturate medications for sleep or seizures, such as phenobarbital Certain antibiotics, such as erythromycin  or clarithromycin Certain medications for anxiety or sleep Certain medications for bladder problems, such as oxybutynin or tolterodine Certain medications for irregular heartbeat Certain medications for mental health conditions Certain medications for Parkinson disease, such as benztropine, trihexyphenidyl Certain medications for seizures, such as phenobarbital or primidone Certain medications for stomach problems, such as dicyclomine or hyoscyamine Certain medications for travel sickness, such as scopolamine  Ipratropium Opioid medications for pain Other medications that cause heart rhythm changes, such as dofetilide This list may not describe all possible interactions. Give your health care provider a list of all the medicines, herbs, non-prescription drugs, or dietary supplements you use. Also tell them if you smoke, drink alcohol, or use illegal drugs. Some items may interact with your medicine. What should I watch for while using this medication? Visit your care team for regular checks on your progress. Tell your care team if your symptoms do not start to get better or if they get worse. This medication may affect your coordination, reaction time, or judgment. Do not drive or operate machinery until you know how this medication affects you. Sit up or stand slowly to reduce the risk of dizzy or fainting spells. Drinking alcohol with this medication can increase the risk of these side effects. Your mouth may get dry. Chewing sugarless  gum or sucking hard candy and drinking plenty of water  may help. Contact your care team  if the problem does not go away or is severe. This medication may cause dry eyes and blurred vision. If you wear contact lenses, you may feel some discomfort. Lubricating eye drops may help. See your care team if the problem does not go away or is severe. If you are receiving skin tests for allergies, tell your care team you are taking this medication. What side effects may I notice from receiving this medication? Side effects that you should report to your care team as soon as possible: Allergic reactions--skin rash, itching, hives, swelling of the face, lips, tongue, or throat Heart rhythm changes--fast or irregular heartbeat, dizziness, feeling faint or lightheaded, chest pain, trouble breathing Side effects that usually do not require medical attention (report to your care team if they continue or are bothersome): Confusion Drowsiness Dry mouth Hallucinations Headache This list may not describe all possible side effects. Call your doctor for medical advice about side effects. You may report side effects to FDA at 1-800-FDA-1088. Where should I keep my medication? Keep out of the reach of children and pets. Store at room temperature between 15 and 30 degrees C (59 and 86 degrees F). Keep container tightly closed. Throw away any unused medication after the expiration date. NOTE: This sheet is a summary. It may not cover all possible information. If you have questions about this medicine, talk to your doctor, pharmacist, or health care provider.  2024 [employer]vier/Gold Standard (2022-02-10 00:00:00)

## 2023-11-12 NOTE — Progress Notes (Signed)
 Psychiatric Initial Adult Assessment   Patient Identification: Tiffany Jarvis MRN:  [id number] Date of Evaluation:  11/12/2023 Referral Source: Creta Dolin MD Chief Complaint:   Chief Complaint  Patient presents with   Establish Care   Anxiety   Depression   Visit Diagnosis:    ICD-10-CM   1. Recurrent major depressive disorder, in full remission (HCC)  F33.42     2. Anxiety disorder, unspecified type  F41.9 hydrOXYzine  (ATARAX ) 25 MG tablet    3. Insomnia due to medical condition  G47.01 hydrOXYzine  (ATARAX ) 25 MG tablet   OSA on Inspire     Discussed the use of AI scribe software for clinical note transcription with the patient, who gave verbal consent to proceed.  History of Present Illness Tiffany Jarvis is a 73 year old female, Caucasian, currently retired, lives in [place], originally from [place] , married, has a history of anxiety, depression, hypertension, hyperlipidemia, obesity, ductal carcinoma in situ of breast, presented to establish care. She was referred by Dr. Tullo for evaluation of her mental health concerns.  She has a long-standing history of anxiety and depression, initially diagnosed over twenty years ago following a stressful work situation and recovery from breast cancer. The work environment was described as 'caustic'. She underwent surgery for ductal carcinoma of the left breast over twenty years ago and has since been in recovery. A significant depressive episode was triggered by a hostile work environment following her breast cancer surgery, leading to anxiety, fear, and paranoia, feeling 'stalked' by her employer. This resulted in a union matter and eventual resignation. She recalls a period of suicidal ideation about thirty years ago but did not act on these thoughts. She has been on venlafaxine  (Effexor ) for anxiety, which stabilizes her mood but blunts her emotional responses.  Recent anxiety related to political events has improved since the  election. She feels lonely and sad due to limited social connections in her new community. She has been on venlafaxine  150 mg.  Recently had an episode of sadness, social withdrawal and anxiety and hence primary care provider tried aripiprazole  (Abilify ) but discontinued it due to weight gain.  Currently does not believe depression as a concern.  Not interested in trial of new medications at this time.  She experiences sleep issues, including difficulty falling asleep and maintaining sleep, despite using the Inspire device for obstructive sleep apnea. She uses the device about four nights a week but experiences anxiety about its use. She has not tried sleep medications before.  She has difficulty falling asleep as well as interrupted sleep.  Denies suicidal thoughts, self-injurious behavior or suicide attempts.  Denies any manic episodes, OCD symptoms or panic attacks.  Reports good support system from her spouse and a few friends.  Currently does not have a therapist however is motivated to establish care.    Associated Signs/Symptoms: Depression Symptoms:  anxiety, (Hypo) Manic Symptoms:   Denies Anxiety Symptoms:   anxiety situational Psychotic Symptoms:   history of paranoia PTSD Symptoms: Negative  Past Psychiatric History: Denies inpatient behavioral health admissions.  Denies suicide attempts however reports an episode when she wanted to drive her car off the road when she was overwhelmed, this may have been 30 years ago.  Denies self-injurious behaviors.  Reports being under the care of a psychiatrist in [place] .  Also reports having a therapist previously.  Previous Psychotropic Medications: Yes Abilify  -weight gain  Substance Abuse History in the last 12 months:  No.  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    Colon  polyps    Depression    History of chicken pox    Hyperlipidemia    Hypertension    Influenza due to influenza virus, type B 08/25/2018   OSA (obstructive sleep apnea)    not able to tolerate cpap or dental mouth piece    Spinal stenosis    Vertigo    CT neck and head negative significant CAS (1 episode - "yrs ago")    Past Surgical History:  Procedure Laterality Date   BREAST BIOPSY Left 2003   positive   BREAST SURGERY     L mastectomy ~2001/2002 and reconstruction   CATARACT EXTRACTION W/PHACO Left 01/28/2020   Procedure: CATARACT EXTRACTION PHACO AND INTRAOCULAR LENS PLACEMENT (IOC) LEFT;  Surgeon: Annell Kidney, MD;  Location: [facility];  Service: Ophthalmology;  Laterality: Left;  5.61 0:53.0 10.6%   CATARACT EXTRACTION W/PHACO Right 03/03/2020   Procedure: CATARACT EXTRACTION PHACO AND INTRAOCULAR LENS PLACEMENT (IOC) RIGHT;  Surgeon: Annell Kidney, MD;  Location: [facility];  Service: Ophthalmology;  Laterality: Right;  2.52 0:41.6 6.0%   CESAREAN SECTION     x 3 1979, 1982, 1992    COLONOSCOPY     COLONOSCOPY WITH PROPOFOL  N/A 04/19/2020   Procedure: COLONOSCOPY WITH BIOPSY;  Surgeon: Marnee Sink, MD;  Location: [facility];  Service: Endoscopy;  Laterality: N/A;  priority 4   DRUG INDUCED ENDOSCOPY N/A 09/22/2020   Procedure: DRUG INDUCED ENDOSCOPY;  Surgeon: Virgina Grills, MD;  Location: [facility];  Service: ENT;  Laterality: N/A;   EYE SURGERY Left 07/2019   retina hole surgery   IMPLANTATION OF HYPOGLOSSAL NERVE STIMULATOR Right 12/15/2020   Procedure: IMPLANTATION OF HYPOGLOSSAL NERVE STIMULATOR;  Surgeon: Virgina Grills, MD;  Location: [facility];  Service: ENT;  Laterality: Right;   MASTECTOMY Left 2003   OVARIAN CYST REMOVAL Right    POLYPECTOMY N/A 04/19/2020   Procedure: POLYPECTOMY;  Surgeon: Marnee Sink, MD;  Location: [facility];  Service: Endoscopy;  Laterality: N/A;   REDUCTION  MAMMAPLASTY Right    TONSILLECTOMY     TRIGGER FINGER RELEASE      Family Psychiatric History: As noted below.  Family History:  Family History  Problem Relation Age of Onset   Cancer Mother        colon cancer   Cancer Sister        breast cancer    Breast cancer Sister    Drug abuse Cousin    Cancer Cousin        m 1st cousin breast and pancreatic ca   Breast cancer Cousin    Cancer Cousin        breast m 1st cousin   Breast cancer Cousin    Cancer Cousin        m 1st cousin breast    Breast cancer Cousin    Cancer Cousin        m 1st cousin breast    Breast cancer Cousin     Social History:   Social History   Socioeconomic History   Marital status: Married    Spouse name: Not on file   Number of children: 3   Years of education: Not on file   Highest education level: Doctorate  Occupational History  Not on file  Tobacco Use   Smoking status: Former    Current packs/day: 0.00    Average packs/day: 0.3 packs/day for 3.0 years (0.8 ttl pk-yrs)    Types: Cigarettes    Start date: 35    Quit date: 59    Years since quitting: 55.3   Smokeless tobacco: Never   Tobacco comments:    former smoker x 3 years 19-24 light   Vaping Use   Vaping status: Never Used  Substance and Sexual Activity   Alcohol use: Yes    Alcohol/week: 1.0 standard drink of alcohol    Types: 1 Glasses of wine per week    Comment: occ   Drug use: Never   Sexual activity: Not Currently    Partners: Male  Other Topics Concern   Not on file  Social History Narrative   Moved from [place] 2019    3 kids and grandkids    Doctorate degree, retired    Married    No guns, wears seat belt safe in relationship    Social Drivers of Corporate investment banker Strain: Low Risk  (11/04/2023)   Overall Financial Resource Strain (CARDIA)    Difficulty of Paying Living Expenses: Not very hard  Food Insecurity: No Food Insecurity (11/04/2023)   Hunger Vital Sign    Worried About Running Out of  Food in the Last Year: Never true    Ran Out of Food in the Last Year: Never true  Transportation Needs: No Transportation Needs (11/04/2023)   PRAPARE - Administrator, Civil Service (Medical): No    Lack of Transportation (Non-Medical): No  Physical Activity: Sufficiently Active (11/04/2023)   Exercise Vital Sign    Days of Exercise per Week: 7 days    Minutes of Exercise per Session: 60 min  Stress: Stress Concern Present (11/04/2023)   [employer] of Occupational Health - Occupational Stress Questionnaire    Feeling of Stress : Very much  Social Connections: Unknown (11/04/2023)   Social Connection and Isolation Panel [NHANES]    Frequency of Communication with Friends and Family: Once a week    Frequency of Social Gatherings with Friends and Family: Patient declined    Attends Religious Services: Never    Database administrator or Organizations: Yes    Attends Engineer, structural: 1 to 4 times per year    Marital Status: Married    Additional Social History: Patient was born and raised in [place]  by both parents.  She reports she had a good childhood.  Has 1 sister, does not have a good relationship.  She has a doctorate degree in education.  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  She is currently retired.  She and her husband moved to [place]  for her husband's job initially lived in [place] later on moved to [place].  She has 3 children, 2 sons and 1 daughter.  She has a good relationship with her children.  She denies having legal issues.  Reports she is spiritual.  Denies access to a gun.  Allergies:  No Known Allergies  Metabolic Disorder Labs: Lab Results  Component Value Date   HGBA1C 5.9 11/08/2023   MPG 126 09/03/2019   MPG 122.63 03/05/2019   No results found for: "PROLACTIN" Lab Results  Component Value Date   CHOL 221 (H) 11/08/2023   TRIG 134 11/08/2023   HDL 75 11/08/2023  CHOLHDL 2.9 11/08/2023   VLDL 16.1 11/07/2022   LDLCALC 121 (H) 11/08/2023   LDLCALC 104 (H) 11/07/2022   Lab Results  Component Value Date   TSH 1.47 11/08/2023    Therapeutic Level Labs: No results found for: "LITHIUM" No results found for: "CBMZ" No results found for: "VALPROATE"  Current Medications: Current Outpatient Medications  Medication Sig Dispense Refill   amLODipine  (NORVASC ) 5 MG tablet Take 1 tablet (5 mg total) by mouth daily. 90 tablet 1   Calcium  Carbonate (CALCIUM  600 PO) Take 1 tablet by mouth daily.     hydrOXYzine  (ATARAX ) 25 MG tablet Take 0.5-1 tablets (12.5-25 mg total) by mouth 2 (two) times daily as needed for anxiety. 60 tablet 1   lisinopril -hydrochlorothiazide  (ZESTORETIC ) 20-12.5 MG tablet TAKE 2 TABLETS BY MOUTH IN THE  MORNING 180 tablet 1   Misc Natural Products (JOINT HEALTH PO) Take by mouth.     Multiple Vitamin (MULTI-VITAMIN DAILY PO) Multi Vitamin     Multiple Vitamins-Minerals (VITAMIN D3 COMPLETE PO) Take 2,000 Units by mouth daily.     Omega-3 Fatty Acids (FISH OIL PO) Take by mouth.     venlafaxine  XR (EFFEXOR -XR) 150 MG 24 hr capsule Take 1 capsule (150 mg total) by mouth daily with breakfast. 90 capsule 1   rosuvastatin  (CRESTOR ) 10 MG tablet Take 1 tablet (10 mg total) by mouth daily. 90 tablet 3   No current facility-administered medications for this visit.    Musculoskeletal: Strength & Muscle Tone: within normal limits Gait & Station: normal Patient leans: N/A  Psychiatric Specialty Exam: Review of Systems  Psychiatric/Behavioral:  Positive for sleep disturbance. The patient is nervous/anxious.     There were no vitals taken for this visit.Reviewed BP 11/08/23- 136/80 , Pulse 76, Wt - 179 lbs, Ht 5'3.5"  General Appearance: Casual  Eye Contact:  Fair  Speech:  Clear and Coherent  Volume:  Normal  Mood:  Anxious  Affect:  Congruent  Thought Process:  Goal Directed and Descriptions of Associations: Intact  Orientation:   Full (Time, Place, and Person)  Thought Content:  Logical  Suicidal Thoughts:  No  Homicidal Thoughts:  No  Memory:  Immediate;   Fair Recent;   Fair Remote;   Fair  Judgement:  Fair  Insight:  Fair  Psychomotor Activity:  Normal  Concentration:  Concentration: Fair and Attention Span: Fair  Recall:  [employer] of Knowledge:Fair  Language: Fair  Akathisia:  No  Handed:  Right  AIMS (if indicated):  not done  Assets:  Desire for Improvement Housing Intimacy Talents/Skills Transportation  ADL's:  Intact  Cognition: WNL  Sleep:   restless , has Inspire , not using it daily though   Screenings: GAD-7    Loss adjuster, chartered Office Visit from 11/12/2023 in [facility]  Total GAD-7 Score 5      PHQ2-9    Flowsheet Row Office Visit from 11/12/2023 in [facility] Office Visit from 11/08/2023 in [facility] Video Visit from 08/29/2023 in [facility] [employer] Video Visit from 04/19/2022 in [facility] [employer] Visit from 11/02/2021 in [facility] [employer] [facility] Total Score 2 2 3  0 2  PHQ-9 Total Score 6 5 5  -- 4      Flowsheet Row Office Visit from 11/12/2023 in [facility] ED from 02/03/2023 in  [facility] ED from 03/28/2022 in [facility] RISK CATEGORY Moderate Risk No Risk No Risk       Assessment and Plan:Tiffany Jarvis is a 73 year old Caucasian female, retired, lives in [place], who originally from [place] , has a history of depression, anxiety, sleep problems, obstructive sleep apnea on Inspire was evaluated in office today presented to establish care.  Assessment & Plan Depression in remission Depression is well-managed with venlafaxine . No  significant depressive symptoms or suicidal ideation. Previous episodes linked to work-related stress and anxiety. Venlafaxine  may contribute to elevated blood pressure. - Continue Venlafaxine  150 mg daily. - Consider gradual tapering of Venlafaxine  in the future with therapist support. - Encourage lifestyle modifications to manage blood pressure.  Anxiety-improving Anxiety is more manageable. Previously exacerbated by political stressors and social isolation. Venlafaxine  effective in managing symptoms. Reports anxiety related to the Inspire device for sleep apnea. Hydroxyzine  prescribed for anxiety and sleep issues. - Start Hydroxyzine  12.5-25 mg twice a day as needed for anxiety and sleep issues. - Encourage regular use of the Inspire device for sleep apnea. - Recommend finding a therapist for ongoing support.  Patient to reach out to [facility] in [place].  Obstructive sleep apnea currently on inspire Managed with the [facility] device. Reports anxiety affecting regular use. Sleep issues include difficulty falling and staying asleep. Hydroxyzine  prescribed to assist with sleep. - Encourage regular use of the Inspire device every night. - Prescribe Hydroxyzine  as needed for sleep issues.  Reviewed labs-TSH-11/08/2023-within normal limits.  Sodium-139-within normal limits. Reviewed notes per Dr. Madelon Scheuermann dated 11/08/2023  Follow-up Follow-up in two months via video chat to assess treatment efficacy and new concerns. - Schedule follow-up appointment in two months via MyChart video chat.   Collaboration of Care: Referral or follow-up with counselor/therapist AEB encouraged to establish care with therapist.  Patient/Guardian was advised Release of Information must be obtained prior to any record release in order to collaborate their care with an outside provider. Patient/Guardian was advised if they have not already done so to contact the registration department to sign all necessary forms in  order for us  to release information regarding their care.   Consent: Patient/Guardian gives verbal consent for treatment and assignment of benefits for services provided during this visit. Patient/Guardian expressed understanding and agreed to proceed.  This note was generated in part or whole with voice recognition software. Voice recognition is usually quite accurate but there are transcription errors that can and very often do occur. I apologize for any typographical errors that were not detected and corrected.    Tiffany Rainwater, MD 5/1/20257:33 AM

## 2023-11-13 NOTE — Assessment & Plan Note (Signed)
 Managed with pravastatin .  LDL is above gaol of 100.  Will recommend change to higher potency statin  . LFTs are normal   Lab Results  Component Value Date   CHOL 221 (H) 11/08/2023   HDL 75 11/08/2023   LDLCALC 121 (H) 11/08/2023   LDLDIRECT 109.0 11/07/2022   TRIG 134 11/08/2023   CHOLHDL 2.9 11/08/2023     Lab Results  Component Value Date   ALT 22 11/08/2023   AST 23 11/08/2023   ALKPHOS 50 11/08/2023   BILITOT 0.8 11/08/2023

## 2023-11-14 MED ORDER — ROSUVASTATIN CALCIUM 10 MG PO TABS: 10.0000 mg | ORAL_TABLET | Freq: Every day | ORAL | 3 refills | Status: DC

## 2023-11-14 NOTE — Addendum Note (Signed)
 Addended by: Thersia Flax on: 11/14/2023 02:48 PM   Modules accepted: Orders

## 2023-11-14 NOTE — Telephone Encounter (Signed)
 Pravastatin  has been canceled.

## 2023-12-04 ENCOUNTER — Other Ambulatory Visit: Payer: Self-pay | Admitting: Psychiatry

## 2023-12-04 DIAGNOSIS — G4701 Insomnia due to medical condition: Secondary | ICD-10-CM

## 2023-12-04 DIAGNOSIS — F419 Anxiety disorder, unspecified: Secondary | ICD-10-CM

## 2023-12-05 ENCOUNTER — Ambulatory Visit

## 2023-12-05 VITALS — BP 146/74 | HR 76

## 2023-12-05 DIAGNOSIS — I10 Essential (primary) hypertension: Secondary | ICD-10-CM

## 2023-12-05 NOTE — Progress Notes (Cosign Needed)
 Patient here for nurse visit BP check per order from Dr Tullo.   Patient reports compliance with prescribed BP medications: yes  Last dose of BP medication: 12/05/23  BP Readings from Last 3 Encounters:  12/05/23 (!) 146/70  11/08/23 136/80  05/09/23 (!) 156/88   Pulse Readings from Last 3 Encounters:  12/05/23 73  11/08/23 76  05/09/23 69     pt is Asymptomatic just encouraged to continue to take reading at home and take bp meds as prescribed  Patient verbalized understanding of instructions.    RN visit reviewed  BP is not at goal on 5 mg amlodipine  and max dose lisinopril  hct  .  PLEASE ADVISE PATIENT  to increase amlodipine  dose to 10 mg daily

## 2023-12-10 ENCOUNTER — Encounter: Payer: Self-pay | Admitting: Internal Medicine

## 2023-12-10 MED ORDER — AMLODIPINE BESYLATE 10 MG PO TABS: 10.0000 mg | ORAL_TABLET | Freq: Every day | ORAL | 1 refills | Status: DC

## 2023-12-10 NOTE — Addendum Note (Signed)
 Addended by: Thersia Flax on: 12/10/2023 09:26 PM   Modules accepted: Orders

## 2023-12-10 NOTE — Assessment & Plan Note (Signed)
 RN visit reviewed  BP is not at goal on 5 mg amlodipine  and max dose lisinopril  hct  advised to increase amlodipine  dose to 10 mg daily

## 2023-12-27 ENCOUNTER — Telehealth: Payer: Self-pay

## 2023-12-27 NOTE — Telephone Encounter (Signed)
 Spoke with patient to notify her per Dr Madelon Scheuermann to make sure she is currently taking the 10 mg dose of Amlodipine . Patient verbalized that she is currently taking that dose. Patient says she is taking a lot of new medications and having some issues with her medications. Patient says she is not sure if it is the Amlodipine , but wanted to follow up with Dr Madelon Scheuermann to discuss. Dr Tullo's first available is 02/20/24 at 3:00 pm. Patient was offered to see another provider here in the office and declined. Patient asked if I would send Dr Madelon Scheuermann and her nurse a message to get her in sooner to discuss. Please advise?

## 2023-12-28 NOTE — Telephone Encounter (Signed)
 Patient was made aware of appointment with provider. Verbalized understanding and has no further questions at this time.

## 2024-01-01 ENCOUNTER — Encounter: Payer: Self-pay | Admitting: Internal Medicine

## 2024-01-01 ENCOUNTER — Ambulatory Visit: Admitting: Internal Medicine

## 2024-01-01 VITALS — BP 122/80 | HR 74 | Ht 63.5 in | Wt 179.6 lb

## 2024-01-01 DIAGNOSIS — I10 Essential (primary) hypertension: Secondary | ICD-10-CM

## 2024-01-01 DIAGNOSIS — R42 Dizziness and giddiness: Secondary | ICD-10-CM

## 2024-01-01 DIAGNOSIS — E7800 Pure hypercholesterolemia, unspecified: Secondary | ICD-10-CM | POA: Diagnosis not present

## 2024-01-01 DIAGNOSIS — M6281 Muscle weakness (generalized): Secondary | ICD-10-CM

## 2024-01-01 DIAGNOSIS — F3342 Major depressive disorder, recurrent, in full remission: Secondary | ICD-10-CM

## 2024-01-01 DIAGNOSIS — R531 Weakness: Secondary | ICD-10-CM | POA: Insufficient documentation

## 2024-01-01 LAB — COMPREHENSIVE METABOLIC PANEL WITH GFR
ALT: 23 U/L (ref 0–35)
AST: 24 U/L (ref 0–37)
Albumin: 4.5 g/dL (ref 3.5–5.2)
Alkaline Phosphatase: 47 U/L (ref 39–117)
BUN: 20 mg/dL (ref 6–23)
CO2: 33 meq/L — ABNORMAL HIGH (ref 19–32)
Calcium: 10.3 mg/dL (ref 8.4–10.5)
Chloride: 100 meq/L (ref 96–112)
Creatinine, Ser: 0.61 mg/dL (ref 0.40–1.20)
GFR: 90.51 mL/min (ref >60.00)
Glucose, Bld: 94 mg/dL (ref 70–99)
Potassium: 3.9 meq/L (ref 3.5–5.1)
Sodium: 139 meq/L (ref 135–145)
Total Bilirubin: 0.7 mg/dL (ref 0.2–1.2)
Total Protein: 7.3 g/dL (ref 6.0–8.3)

## 2024-01-01 LAB — CK: Total CK: 94 U/L (ref 7–177)

## 2024-01-01 MED ORDER — AMLODIPINE BESYLATE 5 MG PO TABS: 5.0000 mg | ORAL_TABLET | Freq: Every day | ORAL | 1 refills | Status: DC

## 2024-01-01 NOTE — Assessment & Plan Note (Signed)
 Intermittent.  Has some difficulty rising from chair unassisted  .  Deconditioning suspected, but checking muscle enzyme today if elevated will dc rosuvastatin .

## 2024-01-01 NOTE — Progress Notes (Signed)
 Subjective:  Patient ID: Tiffany Jarvis, female    DOB: 12-31-1950  Age: 73 y.o. MRN: [id number]  CC: The primary encounter diagnosis was Muscle weakness. Diagnoses of Light headedness, Generalized weakness, Pure hypercholesterolemia, Essential hypertension, Recurrent major depressive disorder, in full remission (HCC), and Primary hypertension were also pertinent to this visit.   HPI Greidy Sherard presents for  Chief Complaint  Patient presents with   Medical Management of Chronic Issues   73 yr old female with history of hypertension  and hyperlipidemia and depression presents for follow up on all issues.   1) HLD:  statin therapy was changed from pravastatin  to rosuvastatin  in late April for goal LDL < 100.  2) HTN:   recent increase in amlodipine  on May 29  .has not been checking home readings.   3)  recent diagnosis of depression aggravated by social isolation .  She continues to take  effexor ,  s/p referral to Dr Tere Felts. Psychiatrywho added hydroxyzine   and referred her to psychology in May.  She had a very difficult time finding a therapist covered by Medicare , took her several weeks,  finally had a virtual appointment with a psychologist that she felt did not really help. Patient   decided to address lifestyle and start exercising  (had stopped in summer 2024 due to hip pain ) .  She enrolled in group pickleball lessions through a recreational department a week ago, has had 2 sessions which she is has found to be very enjoyable.  The teacher is helping her work on balance and hand to eye coordination which is giving her confidence and overcoming her fear of falling   She is also looking forward to  her grown children coming to visit.  She did not tolerate the  hydroxyzine   due to oversedation, dry mouth and dizziness.    4) new onset morning weakness of arms and legs  (feeling wobbly)  not mentioned when she was  last seen in May for CPE.  Reports  intermittent  symptoms more noticeable  since the medication changes in late April.   She has been largely sedentary since last August .   She denies pain but states that she was diagnosed with lumbar spinal stenosis during an orthopedic evaluation of right hip  in [place] last year when she presented with  pain radiating to right hip/leg which limited her walking.  she was sent for [facility] in lumbar spine in July 2024 which was done in July 2024 followed by total right hip  replacement in August .  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 .  The MRI lumbar sine was done by [facility]   Outpatient Medications Prior to Visit  Medication Sig Dispense Refill   Calcium  Carbonate (CALCIUM  600 PO) Take 1 tablet by mouth daily.     hydrOXYzine  (ATARAX ) 25 MG tablet TAKE 0.5-1 TABLETS (12.5-25 MG TOTAL) BY MOUTH 2 (TWO) TIMES DAILY AS NEEDED FOR ANXIETY. 180 tablet 0   lisinopril -hydrochlorothiazide  (ZESTORETIC ) 20-12.5 MG tablet TAKE 2 TABLETS BY MOUTH IN THE  MORNING 180 tablet 1   Misc Natural Products (JOINT HEALTH PO) Take by mouth.     Multiple Vitamin (MULTI-VITAMIN DAILY PO) Multi Vitamin     Multiple Vitamins-Minerals (VITAMIN D3 COMPLETE PO) Take 2,000 Units by mouth daily.     Omega-3 Fatty Acids (FISH OIL  PO) Take by mouth.     rosuvastatin  (CRESTOR ) 10 MG tablet Take 1 tablet (10 mg total) by mouth daily. 90 tablet 3   venlafaxine  XR (EFFEXOR -XR) 150 MG 24 hr capsule Take 1 capsule (150 mg total) by mouth daily with breakfast. 90 capsule 1   amLODipine  (NORVASC ) 10 MG tablet Take 1 tablet (10 mg total) by mouth daily. 90 tablet 1   No facility-administered medications prior to visit.    Review of Systems;  Patient denies headache, fevers, malaise, unintentional weight loss, skin rash, eye pain, sinus congestion and sinus pain, sore throat, dysphagia,  hemoptysis , cough, dyspnea, wheezing, chest pain,  palpitations, orthopnea, edema, abdominal pain, nausea, melena, diarrhea, constipation, flank pain, dysuria, hematuria, urinary  Frequency, nocturia, numbness, tingling, seizures,  Focal weakness, Loss of consciousness,  Tremor, insomnia, depression, anxiety, and suicidal ideation.      Objective:  BP 122/80   Pulse 74   Ht 5' 3.5 (1.613 m)   Wt 179 lb 9.6 oz (81.5 kg)   SpO2 99%   BMI 31.32 kg/m   BP Readings from Last 3 Encounters:  01/01/24 122/80  12/05/23 (!) 146/74  11/08/23 136/80    Wt Readings from Last 3 Encounters:  01/01/24 179 lb 9.6 oz (81.5 kg)  11/08/23 179 lb 6.4 oz (81.4 kg)  08/29/23 175 lb (79.4 kg)    Physical Exam Vitals reviewed.  Constitutional:      General: She is not in acute distress.    Appearance: Normal appearance. She is normal weight. She is not ill-appearing, toxic-appearing or diaphoretic.  HENT:     Head: Normocephalic.   Eyes:     General: No scleral icterus.       Right eye: No discharge.        Left eye: No discharge.     Conjunctiva/sclera: Conjunctivae normal.    Cardiovascular:     Rate and Rhythm: Normal rate and regular rhythm.     Heart sounds: Normal heart sounds.  Pulmonary:     Effort: Pulmonary effort is normal. No respiratory distress.     Breath sounds: Normal breath sounds.   Musculoskeletal:        General: Normal range of motion.   Skin:    General: Skin is warm and dry.   Neurological:     General: No focal deficit present.     Mental Status: She is alert and oriented to person, place, and time. Mental status is at baseline.     Sensory: Sensory deficit present.   Psychiatric:        Mood and Affect: Mood normal.        Behavior: Behavior normal.        Thought Content: Thought content normal.        Judgment: Judgment normal.   Lab Results  Component Value Date   HGBA1C 5.9 11/08/2023   HGBA1C 5.9 11/07/2022   HGBA1C 5.9 11/02/2021    Lab Results  Component Value Date   CREATININE 0.61  11/08/2023   CREATININE 0.63 11/07/2022   CREATININE 0.61 11/02/2021    Lab Results  Component Value Date   WBC 7.2 11/07/2022   HGB 13.3 11/07/2022   HCT 38.3 11/07/2022   PLT 236.0 11/07/2022   GLUCOSE 101 (H) 11/08/2023   CHOL 221 (H) 11/08/2023   TRIG 134 11/08/2023   HDL 75 11/08/2023   LDLDIRECT 109.0 11/07/2022   LDLCALC 121 (H) 11/08/2023   ALT 22 11/08/2023   AST 23  11/08/2023   NA 139 11/08/2023   K 4.0 11/08/2023   CL 99 11/08/2023   CREATININE 0.61 11/08/2023   BUN 12 11/08/2023   CO2 32 11/08/2023   TSH 1.47 11/08/2023   INR 1.1 (H) 11/02/2021   HGBA1C 5.9 11/08/2023    No results found.  Assessment & Plan:  .Muscle weakness -     CK -     Comprehensive metabolic panel with GFR  Light headedness Assessment & Plan: Attributed to drop in systolic pressure of 10 pt during orthostatic measurement. Reduce amlodipine  to 5 mg daily    Generalized weakness Assessment & Plan: Intermittent.  Has some difficulty rising from chair unassisted  .  Deconditioning suspected, but checking muscle enzyme today if elevated will dc rosuvastatin .     Pure hypercholesterolemia Assessment & Plan: Reducing crestor  to every other day pending review of CK  Orders: -     amLODIPine  Besylate; Take 1 tablet (5 mg total) by mouth daily.  Dispense: 90 tablet; Refill: 1  Essential hypertension -     amLODIPine  Besylate; Take 1 tablet (5 mg total) by mouth daily.  Dispense: 90 tablet; Refill: 1  Recurrent major depressive disorder, in full remission (HCC) Assessment & Plan: MOOD CONSIDERABLY IMPROVED WITH LIFESTYLE CHANGE.  DECLINES FOLLOW UP WITH PSYCHIATRY/PSYCHOLOGY.  CONTINUE EFFEXOR  .    Primary hypertension Assessment & Plan: Orthostatic today and symptomatic.  Reduce amlodipine  to 5 mg       I spent 344minutes on the day of this face to face encounter reviewing patient's  most recent visit with psychiatry ,  prior relevant surgical and non surgical procedures,  recent  labs and imaging studies, counseling on management of muscle weakness ,  reviewing the assessment and plan with patient, and post visit ordering and reviewing of  diagnostics and therapeutics with patient  .   Follow-up: Return in about 4 weeks (around 01/29/2024).   Thersia Flax, MD

## 2024-01-01 NOTE — Assessment & Plan Note (Signed)
 Reducing crestor  to every other day pending review of CK

## 2024-01-01 NOTE — Patient Instructions (Addendum)
 I'm glad you are feeling more optimistic .  I see no reason to stop the venlafaxine  at this time  You might want to try using Relaxium for insomnia  (as seen on TV commercials) . It is available through [employer] and contains all natural supplements:  Melatonin 5 mg  Chamomile 25 mg Passionflower extract 75 mg GABA 100 mg Ashwaganda extract 125 mg Magnesium citrate, glycinate, oxide (100 mg)  L tryptophan 500 mg Valerest (proprietary  ingredient ; probably valeria root extract)     I will contact you regarding the effect or rosuvstatin on your muscles. .  You may reduce the dose to every other day for now   Please check your blood pressure a few times at home (vary the times )   and send me the readings via mychart so I can determine if you need to  reduce your amlodipine  .

## 2024-01-01 NOTE — Assessment & Plan Note (Signed)
 Orthostatic today and symptomatic.  Reduce amlodipine  to 5 mg

## 2024-01-01 NOTE — Assessment & Plan Note (Signed)
 Attributed to drop in systolic pressure of 10 pt during orthostatic measurement. Reduce amlodipine  to 5 mg daily

## 2024-01-01 NOTE — Assessment & Plan Note (Signed)
 MOOD CONSIDERABLY IMPROVED WITH LIFESTYLE CHANGE.  DECLINES FOLLOW UP WITH PSYCHIATRY/PSYCHOLOGY.  CONTINUE EFFEXOR  .

## 2024-01-02 ENCOUNTER — Encounter: Payer: Self-pay | Admitting: Internal Medicine

## 2024-01-02 ENCOUNTER — Ambulatory Visit: Payer: Self-pay | Admitting: Internal Medicine

## 2024-01-02 NOTE — Telephone Encounter (Signed)
 Bp reading for today. Notified pt to gather more readings and send at one time

## 2024-01-07 ENCOUNTER — Encounter: Payer: Self-pay | Admitting: Internal Medicine

## 2024-01-07 DIAGNOSIS — I10 Essential (primary) hypertension: Secondary | ICD-10-CM

## 2024-01-08 ENCOUNTER — Encounter: Payer: Self-pay | Admitting: Internal Medicine

## 2024-01-08 MED ORDER — TELMISARTAN 40 MG PO TABS: 40.0000 mg | ORAL_TABLET | Freq: Every day | ORAL | 1 refills | Status: DC

## 2024-01-08 NOTE — Assessment & Plan Note (Signed)
 Reduced amlodipine  to 5 mg .  Subsequent readings are elevated.  Will change lisinopril  to telmisartan

## 2024-01-11 ENCOUNTER — Telehealth: Payer: Self-pay

## 2024-01-11 NOTE — Telephone Encounter (Signed)
 Copied from CRM [id number]. Topic: Clinical - Prescription Issue >> Jan 11, 2024  2:38 PM Tiffany Jarvis wrote: Reason for CRM: Patient is calling in stating that [facility] contacted her and stated that they need the doctor to call them, regarding her recent medication. Please follow up with pharmacy

## 2024-01-14 ENCOUNTER — Telehealth: Admitting: Psychiatry

## 2024-01-14 NOTE — Telephone Encounter (Signed)
 [facility] faxed a form for medication clarification. Form has been completed signed and faxed back.

## 2024-01-30 ENCOUNTER — Ambulatory Visit: Admitting: Internal Medicine

## 2024-02-01 NOTE — Telephone Encounter (Signed)
 Called patient to confirm no acute symptoms. Offered earlier appointment for BP issues. Pt declined and says this is just an update for you. She is going to check the batteries in her cuff and see if this makes a difference. Has f/u in 2 weeks.

## 2024-02-04 ENCOUNTER — Encounter: Payer: Self-pay | Admitting: Internal Medicine

## 2024-02-04 DIAGNOSIS — I10 Essential (primary) hypertension: Secondary | ICD-10-CM

## 2024-02-05 MED ORDER — TELMISARTAN 80 MG PO TABS: 80.0000 mg | ORAL_TABLET | Freq: Every day | ORAL | 1 refills | Status: DC

## 2024-02-05 NOTE — Assessment & Plan Note (Signed)
 Telmisartan  dose increased to 80 mg daily for home readings > 150/90

## 2024-02-18 ENCOUNTER — Ambulatory Visit: Admitting: Internal Medicine

## 2024-02-18 ENCOUNTER — Encounter: Payer: Self-pay | Admitting: Internal Medicine

## 2024-02-18 VITALS — BP 144/76 | HR 85 | Ht 63.5 in | Wt 180.4 lb

## 2024-02-18 DIAGNOSIS — G4733 Obstructive sleep apnea (adult) (pediatric): Secondary | ICD-10-CM | POA: Diagnosis not present

## 2024-02-18 DIAGNOSIS — R232 Flushing: Secondary | ICD-10-CM | POA: Diagnosis not present

## 2024-02-18 DIAGNOSIS — I10 Essential (primary) hypertension: Secondary | ICD-10-CM | POA: Diagnosis not present

## 2024-02-18 DIAGNOSIS — E669 Obesity, unspecified: Secondary | ICD-10-CM

## 2024-02-18 MED ORDER — HYDROCHLOROTHIAZIDE 25 MG PO TABS
25.0000 mg | ORAL_TABLET | Freq: Every day | ORAL | 0 refills | Status: DC
Start: 2024-02-18 — End: 2024-03-18

## 2024-02-18 MED ORDER — VENLAFAXINE HCL ER 150 MG PO CP24: 150.0000 mg | ORAL_CAPSULE | Freq: Every day | ORAL | 1 refills | Status: DC

## 2024-02-18 NOTE — Patient Instructions (Signed)
 Continue 80 mg telmisartan  and 5 mg amlodipine   Add back 25 mg hydrochlorothiazide  ON THE DAYS YOU ARE NOT PLAYING PICKLEBALL  GOAL BP IS 130/80     HYDRATE WITH GATORADE,  G2  (LESS SUGAR) ,  OR PROPEL WHEN YOU ARE ACTIVE AND SWEATING ,  TO REPLACE THE ELECTROLYTES

## 2024-02-18 NOTE — Progress Notes (Unsigned)
 Subjective:  Patient ID: Tiffany Jarvis, female    DOB: [birth year]  Age: 73 y.o. MRN: [id number]  CC: The primary encounter diagnosis was Obstructive sleep apnea syndrome. Diagnoses of Hot flashes, Obesity (BMI 30-39.9), and Primary hypertension were also pertinent to this visit.   HPI Tiffany Jarvis presents for  Chief Complaint  Patient presents with   Medical Management of Chronic Issues    1 week follow up    1) HTN:  has been taking 80 mg telmisartan  and 5 mg amlodipine  since July 22  .  Readings remain elevated on home machine but are elevated compared to direct measurement today with office manometry   Had a severe muscle cramp RLE after playing pickle ball in the heat.   She has not been using electrolyte replacement drinks   Husband needs a new PCP from [place] closer to [place]:  has had valve replacement,  CKD  and obesity and has not been referred to nephrology Tiffany Jarvis [birth year]   2) obesity:  she is struggling to lose weight on her own.  Reviewed diet and exercise commitments today.   Outpatient Medications Prior to Visit  Medication Sig Dispense Refill   amLODipine  (NORVASC ) 5 MG tablet Take 1 tablet (5 mg total) by mouth daily. 90 tablet 1   Calcium  Carbonate (CALCIUM  600 PO) Take 1 tablet by mouth daily.     Misc Natural Products (JOINT HEALTH PO) Take by mouth.     Multiple Vitamin (MULTI-VITAMIN DAILY PO) Multi Vitamin     Multiple Vitamins-Minerals (VITAMIN D3 COMPLETE PO) Take 2,000 Units by mouth daily.     Omega-3 Fatty Acids (FISH OIL PO) Take by mouth.     rosuvastatin  (CRESTOR ) 10 MG tablet Take 1 tablet (10 mg total) by mouth daily. 90 tablet 3   telmisartan  (MICARDIS ) 80 MG tablet Take 1 tablet (80 mg total) by mouth at bedtime. 90 tablet 1   venlafaxine  XR (EFFEXOR -XR) 150 MG 24 hr capsule Take 1 capsule (150 mg total) by mouth daily with breakfast. 90 capsule 1   hydrOXYzine  (ATARAX ) 25 MG tablet TAKE 0.5-1 TABLETS (12.5-25 MG TOTAL) BY MOUTH 2 (TWO) TIMES  DAILY AS NEEDED FOR ANXIETY. 180 tablet 0   No facility-administered medications prior to visit.    Review of Systems;  Patient denies headache, fevers, malaise, unintentional weight loss, skin rash, eye pain, sinus congestion and sinus pain, sore throat, dysphagia,  hemoptysis , cough, dyspnea, wheezing, chest pain, palpitations, orthopnea, edema, abdominal pain, nausea, melena, diarrhea, constipation, flank pain, dysuria, hematuria, urinary  Frequency, nocturia, numbness, tingling, seizures,  Focal weakness, Loss of consciousness,  Tremor, insomnia, depression, anxiety, and suicidal ideation.      Objective:  BP (!) 144/76   Pulse 85   Ht 5' 3.5 (1.613 m)   Wt 180 lb 6.4 oz (81.8 kg)   SpO2 99%   BMI 31.46 kg/m   BP Readings from Last 3 Encounters:  02/18/24 (!) 144/76  01/01/24 122/80  12/05/23 (!) 146/74    Wt Readings from Last 3 Encounters:  02/18/24 180 lb 6.4 oz (81.8 kg)  01/01/24 179 lb 9.6 oz (81.5 kg)  11/08/23 179 lb 6.4 oz (81.4 kg)    Physical Exam Vitals reviewed.  Constitutional:      General: She is not in acute distress.    Appearance: Normal appearance. She is normal weight. She is not ill-appearing, toxic-appearing or diaphoretic.  HENT:     Head: Normocephalic.  Eyes:  General: No scleral icterus.       Right eye: No discharge.        Left eye: No discharge.     Conjunctiva/sclera: Conjunctivae normal.  Cardiovascular:     Rate and Rhythm: Normal rate and regular rhythm.     Heart sounds: Normal heart sounds.  Pulmonary:     Effort: Pulmonary effort is normal. No respiratory distress.     Breath sounds: Normal breath sounds.  Musculoskeletal:        General: Normal range of motion.  Skin:    General: Skin is warm and dry.  Neurological:     General: No focal deficit present.     Mental Status: She is alert and oriented to person, place, and time. Mental status is at baseline.  Psychiatric:        Mood and Affect: Mood normal.         Behavior: Behavior normal.        Thought Content: Thought content normal.        Judgment: Judgment normal.     Lab Results  Component Value Date   HGBA1C 5.9 11/08/2023   HGBA1C 5.9 11/07/2022   HGBA1C 5.9 11/02/2021    Lab Results  Component Value Date   CREATININE 0.61 01/01/2024   CREATININE 0.61 11/08/2023   CREATININE 0.63 11/07/2022    Lab Results  Component Value Date   WBC 7.2 11/07/2022   HGB 13.3 11/07/2022   HCT 38.3 11/07/2022   PLT 236.0 11/07/2022   GLUCOSE 94 01/01/2024   CHOL 221 (H) 11/08/2023   TRIG 134 11/08/2023   HDL 75 11/08/2023   LDLDIRECT 109.0 11/07/2022   LDLCALC 121 (H) 11/08/2023   ALT 23 01/01/2024   AST 24 01/01/2024   NA 139 01/01/2024   K 3.9 01/01/2024   CL 100 01/01/2024   CREATININE 0.61 01/01/2024   BUN 20 01/01/2024   CO2 33 (H) 01/01/2024   TSH 1.47 11/08/2023   INR 1.1 (H) 11/02/2021   HGBA1C 5.9 11/08/2023    No results found.  Assessment & Plan:  .Obstructive sleep apnea syndrome Assessment & Plan: Sleeping fine since receiving [facility]SP[facility] 2 years ago. Has deferred follow up sleep test    Hot flashes -     Venlafaxine  HCl ER; Take 1 capsule (150 mg total) by mouth daily with breakfast.  Dispense: 90 capsule; Refill: 1  Obesity (BMI 30-39.9) Assessment & Plan: Complicated by OSA< fatty liver and HTN.  I have encouraged her to address  her inability to lose weight with portion reduction, a low glycemic index diet and increased participation in regular exercise a minimum of 5 days per week.    She has no contraindications to GLP ! Agonist therapy but remains motivated to lose weight without pharmacotherapy    Primary hypertension Assessment & Plan: Not at goal on telmisatan 80 mg and amlodipine  5 mg .  Resuming hydrochlorothiazide . Which I have advised her to suspend on days she plays pickleball to prevent cramping dueto electrolyte derangement    Other orders -     hydroCHLOROthiazide ; Take 1 tablet (25 mg total)  by mouth daily.  Dispense: 90 tablet; Refill: 0     I spent 34 minutes on the day of this face to face encounter reviewing patient's  most recent vhome blood pressure readings , recent  labs and imaging studies, counseling on weight management,  reviewing the assessment and plan with patient, and post visit ordering and reviewing of  diagnostics and therapeutics with patient  .   Follow-up: Return in about 3 months (around 05/20/2024) for hypertension.   Verneita LITTIE Kettering, MD

## 2024-02-19 ENCOUNTER — Encounter: Payer: Self-pay | Admitting: Internal Medicine

## 2024-02-19 NOTE — Assessment & Plan Note (Signed)
 Not at goal on telmisatan 80 mg and amlodipine  5 mg .  Resuming hydrochlorothiazide . Which I have advised her to suspend on days she plays pickleball to prevent cramping dueto electrolyte derangement

## 2024-02-19 NOTE — Assessment & Plan Note (Addendum)
 Complicated by OSA< fatty liver and HTN.  I have encouraged her to address  her inability to lose weight with portion reduction, a low glycemic index diet and increased participation in regular exercise a minimum of 5 days per week.    She has no contraindications to GLP ! Agonist therapy but remains motivated to lose weight without pharmacotherapy

## 2024-02-19 NOTE — Assessment & Plan Note (Signed)
Sleeping fine since receiving [employer] 2 years ago. Has deferred follow up sleep test

## 2024-02-28 ENCOUNTER — Ambulatory Visit: Payer: Self-pay | Admitting: Internal Medicine

## 2024-02-28 DIAGNOSIS — I1A0 Resistant hypertension: Secondary | ICD-10-CM

## 2024-02-28 NOTE — Telephone Encounter (Signed)
 Spoke with pt and scheduled her a nurse visit tomorrow.

## 2024-02-28 NOTE — Telephone Encounter (Signed)
 Unlikely due to be the hydrochlorothiazide  since she was tolerating  in the past in combination with lisinopril   but Please schedule her an RN visit and lab visit tomorrow to check electrolytes and  orthostatics.   If her labs are off we  will stop the hydrochlorothiazide  and find an alterntive med for her blood pressure

## 2024-02-28 NOTE — Telephone Encounter (Signed)
 FYI Only or Action Required?: Action required by provider: clinical question for provider.  Patient was last seen in primary care on 02/18/2024 by [name], MD.  Called Nurse Triage reporting Vomiting.  Symptoms began yesterday.  Triage Disposition: Call PCP When Office is Open  Patient/caregiver understands and will follow disposition?: Yes                        Copied from CRM [id number]. Topic: Clinical - Red Word Triage >> Feb 28, 2024  2:54 PM Rea C wrote: Red Word that prompted transfer to Nurse Triage: Either adverse reaction to medication or heat exhaustion- excessive sweating and vomiting yesterday.   Patient went out after dinner to walk her dog, and on the way back, patient started to sweat perfusely. Patient called her husband because she thought she was going to collapse. When patient got inside the house, she vomited.  Patient thought it was heat exhaustion but she is not sure if it was also the medication.  Hydrochlorothiazide  Reason for Disposition  [1] Caller requesting NON-URGENT health information AND [2] PCP's office is the best resource  Answer Assessment - Initial Assessment Questions This RN will send a high priority message to PCP. Pt call back number is [phone]  Pt was prescribe hydrochlorothiazide  on 02/18/2024. Pt states she walked her dog last night; denies it being an exertion walk; pt states a few feet from house she started sweating and felt dizzy like she was going to collapse; pt felt nauseous and called husband who helped her get back to house  Pt vomited quite a bit x2 times Pt feels fine today just tired; denies nausea or sweating Pt states she is trying to drink as much as she can today Denies this happening before  Protocols used: Vomiting-A-AH, Information Only Call - No Triage-A-AH

## 2024-02-28 NOTE — Telephone Encounter (Signed)
 Patient called stating she was concerned about possibly being admitted to the hospital after getting lab work done. Patient advised that this RN is unsure of outcome of results and that this would be determined by her provider. Patient states she lives far away from the hospital and just wants to be prepared for being admitted. Patient told we will reach out to her provider's office and let them know about her concerns. Patient verbalized understanding.       Copied from CRM #[id number]. Topic: Clinical - Medical Advice >> Feb 28, 2024  5:02 PM Rea C wrote: Reason for CRM: Patient called back in. She has an appointment tomorrow from speaking with RN today for red word. Patient would like to know if she should make arrangements for a possible hospital stay, if that comes up, due to her husbands work schedule. She would like to speak to a nurse at this moment for guidance.

## 2024-02-29 ENCOUNTER — Ambulatory Visit

## 2024-02-29 DIAGNOSIS — I1A0 Resistant hypertension: Secondary | ICD-10-CM

## 2024-02-29 NOTE — Telephone Encounter (Signed)
 Spoke with pt to make sure she was not having any symptoms that made her think that she needs to be in the hospital. Pt stated she is not having any symptoms she just wanted to know if something was off in her blood work would she need to go to the hospital. I explained to pt that we would not know until other blood work was done and if something is off does not always mean that she would need to go to the ED. Pt gave a verbal understanding.

## 2024-02-29 NOTE — Progress Notes (Signed)
 Pt presented today for orthostatic vitals per Dr. Lula request.   Laying:  134/88  HR: 83 Sitting:   124/82  HR: 91 Standing: 124/86  HR: 97  Pt stated that is fine just doesn't feel like herself since starting on the hydrochlorothiazide . Pt stated that she is not sure what the purpose in this medication is. Pt was advised that if any changes were needed to any thing we would give her a call or send her a mychart. Also let her know that we would let her know about her lab results when we get them back. Pt gave a verbal understanding.

## 2024-03-01 LAB — BASIC METABOLIC PANEL WITH GFR
BUN: 18 mg/dL (ref 7–25)
CO2: 29 mmol/L (ref 20–32)
Calcium: 9.7 mg/dL (ref 8.6–10.4)
Chloride: 101 mmol/L (ref 98–110)
Creat: 0.65 mg/dL (ref 0.60–1.00)
Glucose, Bld: 116 mg/dL — ABNORMAL HIGH (ref 65–99)
Potassium: 3.6 mmol/L (ref 3.5–5.3)
Sodium: 140 mmol/L (ref 135–146)
eGFR: 95 mL/min/1.73m2 (ref >=60)

## 2024-03-03 ENCOUNTER — Other Ambulatory Visit: Payer: Self-pay | Admitting: Psychiatry

## 2024-03-03 ENCOUNTER — Ambulatory Visit: Payer: Self-pay | Admitting: Internal Medicine

## 2024-03-03 DIAGNOSIS — G4701 Insomnia due to medical condition: Secondary | ICD-10-CM

## 2024-03-03 DIAGNOSIS — F419 Anxiety disorder, unspecified: Secondary | ICD-10-CM

## 2024-03-03 NOTE — Assessment & Plan Note (Signed)
 Did not tolerate addition of hydrochlorothiazide  to telmisartan  and amlodipine  due to orthostasis  stopping hydrochlorothiazide 

## 2024-03-11 ENCOUNTER — Telehealth: Payer: Self-pay

## 2024-03-11 ENCOUNTER — Encounter: Payer: Self-pay | Admitting: Internal Medicine

## 2024-03-11 MED ORDER — TELMISARTAN 80 MG PO TABS: 80.0000 mg | ORAL_TABLET | Freq: Every day | ORAL | 0 refills | Status: DC

## 2024-03-11 MED ORDER — TELMISARTAN 80 MG PO TABS: 80.0000 mg | ORAL_TABLET | Freq: Every day | ORAL | 1 refills | Status: DC

## 2024-03-11 NOTE — Telephone Encounter (Signed)
 Copied from CRM [id number]. Topic: Clinical - Prescription Issue >> Mar 11, 2024  2:47 PM Tiffany Jarvis wrote: Reason for CRM: Patient called because her prescription  telmisartan  (MICARDIS ) 80 MG tablet was sent to the wrong pharmacy ([place]) but it should've [facility] filled at [facility] in [facility]. Called pharmacy in [place] and they advised her that they couldn't refill it and she would have to speak to her insurance. Patient just wants to wait for order to come into the mail and wants the doctor to know her decision. And if she could respond through MyChart

## 2024-03-11 NOTE — Telephone Encounter (Unsigned)
 Copied from CRM [id number]. Topic: Clinical - Prescription Issue >> Mar 11, 2024  2:40 PM Deaijah H wrote: Reason for CRM: Jerre w/ [facility] called in to let provider know telmisartan  (MICARDIS ) 80 MG tablet insurance won't pay for 10 day supply because she already has an 90 day supply.

## 2024-03-12 NOTE — Telephone Encounter (Signed)
 Spoke with pt and she stated that was finally able to get the Telmisartan  filled today at the [facility] here in [place].

## 2024-03-17 ENCOUNTER — Encounter: Payer: Self-pay | Admitting: Internal Medicine

## 2024-03-17 DIAGNOSIS — I10 Essential (primary) hypertension: Secondary | ICD-10-CM

## 2024-03-18 ENCOUNTER — Other Ambulatory Visit: Payer: Self-pay | Admitting: Internal Medicine

## 2024-03-18 MED ORDER — NEBIVOLOL HCL 2.5 MG PO TABS: 2.5000 mg | ORAL_TABLET | Freq: Every day | ORAL | 0 refills | Status: DC

## 2024-03-18 NOTE — Assessment & Plan Note (Addendum)
 HOme BP readings were not at goal on 5 mg amlodipine   and 80 mg telmisartan  . Did not tolerate hydrochlorothiazide  and 10 mg amlodipne ue to orthostasis  .  Patient was switched to Bystolic  2.5 mg daily . Readings remained above goal per mychart message sept 17.  Advised to increase dose to 5 mg daily

## 2024-04-02 ENCOUNTER — Other Ambulatory Visit: Payer: Self-pay

## 2024-04-02 MED ORDER — NEBIVOLOL HCL 2.5 MG PO TABS: 2.5000 mg | ORAL_TABLET | Freq: Every day | ORAL | 1 refills | Status: DC

## 2024-04-04 MED ORDER — NEBIVOLOL HCL 5 MG PO TABS: 5.0000 mg | ORAL_TABLET | Freq: Every day | ORAL | 0 refills | Status: DC

## 2024-04-04 NOTE — Addendum Note (Signed)
 Addended by: Michal Strzelecki on: 04/04/2024 11:18 AM   Modules accepted: Orders

## 2024-04-26 ENCOUNTER — Other Ambulatory Visit: Payer: Self-pay | Admitting: Internal Medicine

## 2024-05-03 ENCOUNTER — Encounter: Payer: Self-pay | Admitting: Internal Medicine

## 2024-05-05 MED ORDER — NEBIVOLOL HCL 5 MG PO TABS: 5.0000 mg | ORAL_TABLET | Freq: Every day | ORAL | 1 refills | Status: DC

## 2024-05-07 ENCOUNTER — Ambulatory Visit
Admission: RE | Admit: 2024-05-07 | Discharge: 2024-05-07 | Disposition: A | Discharge status: Home / Self Care | Source: Ambulatory Visit | Attending: Internal Medicine | Admitting: Internal Medicine

## 2024-05-07 DIAGNOSIS — Z1231 Encounter for screening mammogram for malignant neoplasm of breast: Secondary | ICD-10-CM | POA: Insufficient documentation

## 2024-05-14 ENCOUNTER — Encounter: Payer: Self-pay | Admitting: Internal Medicine

## 2024-05-14 ENCOUNTER — Ambulatory Visit: Admitting: Internal Medicine

## 2024-05-14 VITALS — BP 136/76 | HR 56 | Ht 63.5 in | Wt 179.0 lb

## 2024-05-14 DIAGNOSIS — E7800 Pure hypercholesterolemia, unspecified: Secondary | ICD-10-CM

## 2024-05-14 DIAGNOSIS — R531 Weakness: Secondary | ICD-10-CM

## 2024-05-14 DIAGNOSIS — E669 Obesity, unspecified: Secondary | ICD-10-CM

## 2024-05-14 DIAGNOSIS — R7301 Impaired fasting glucose: Secondary | ICD-10-CM

## 2024-05-14 DIAGNOSIS — G4733 Obstructive sleep apnea (adult) (pediatric): Secondary | ICD-10-CM

## 2024-05-14 DIAGNOSIS — R5383 Other fatigue: Secondary | ICD-10-CM

## 2024-05-14 DIAGNOSIS — I10 Essential (primary) hypertension: Secondary | ICD-10-CM

## 2024-05-14 LAB — CBC WITH DIFFERENTIAL/PLATELET
Basophils Absolute: 0.1 K/uL (ref 0.0–0.1)
Basophils Relative: 0.7 % (ref 0.0–3.0)
Eosinophils Absolute: 0.1 K/uL (ref 0.0–0.7)
Eosinophils Relative: 0.8 % (ref 0.0–5.0)
HCT: 41.9 % (ref 36.0–46.0)
Hemoglobin: 14.1 g/dL (ref 12.0–15.0)
Lymphocytes Relative: 32.5 % (ref 12.0–46.0)
Lymphs Abs: 2.7 K/uL (ref 0.7–4.0)
MCHC: 33.7 g/dL (ref 30.0–36.0)
MCV: 90.2 fl (ref 78.0–100.0)
Monocytes Absolute: 0.6 K/uL (ref 0.1–1.0)
Monocytes Relative: 7.7 % (ref 3.0–12.0)
Neutro Abs: 4.8 K/uL (ref 1.4–7.7)
Neutrophils Relative %: 58.3 % (ref 43.0–77.0)
Platelets: 264 K/uL (ref 150.0–400.0)
RBC: 4.64 Mil/uL (ref 3.87–5.11)
RDW: 12.9 % (ref 11.5–15.5)
WBC: 8.2 K/uL (ref 4.0–10.5)

## 2024-05-14 LAB — LIPID PANEL
Cholesterol: 187 mg/dL (ref 0–200)
HDL: 79.8 mg/dL (ref >39.00)
LDL Cholesterol: 90 mg/dL (ref 0–99)
NonHDL: 106.96
Total CHOL/HDL Ratio: 2
Triglycerides: 87 mg/dL (ref 0.0–149.0)
VLDL: 17.4 mg/dL (ref 0.0–40.0)

## 2024-05-14 LAB — COMPREHENSIVE METABOLIC PANEL WITH GFR
ALT: 31 U/L (ref 0–35)
AST: 23 U/L (ref 0–37)
Albumin: 4.5 g/dL (ref 3.5–5.2)
Alkaline Phosphatase: 62 U/L (ref 39–117)
BUN: 21 mg/dL (ref 6–23)
CO2: 30 meq/L (ref 19–32)
Calcium: 9.5 mg/dL (ref 8.4–10.5)
Chloride: 101 meq/L (ref 96–112)
Creatinine, Ser: 0.65 mg/dL (ref 0.40–1.20)
GFR: 88.91 mL/min (ref >60.00)
Glucose, Bld: 93 mg/dL (ref 70–99)
Potassium: 4.2 meq/L (ref 3.5–5.1)
Sodium: 139 meq/L (ref 135–145)
Total Bilirubin: 0.7 mg/dL (ref 0.2–1.2)
Total Protein: 7.3 g/dL (ref 6.0–8.3)

## 2024-05-14 LAB — MICROALBUMIN / CREATININE URINE RATIO
Creatinine,U: 89.9 mg/dL
Microalb Creat Ratio: 11.5 mg/g (ref 0.0–30.0)
Microalb, Ur: 1 mg/dL (ref 0.0–1.9)

## 2024-05-14 LAB — TSH: TSH: 0.99 u[IU]/mL (ref 0.35–5.50)

## 2024-05-14 LAB — HEMOGLOBIN A1C: Hgb A1c MFr Bld: 6 % (ref 4.6–6.5)

## 2024-05-14 LAB — LDL CHOLESTEROL, DIRECT: Direct LDL: 90 mg/dL

## 2024-05-14 NOTE — Assessment & Plan Note (Signed)
Sleeping fine since receiving [employer] 2 years ago. Has deferred follow up sleep test

## 2024-05-14 NOTE — Assessment & Plan Note (Signed)
 improving with physical conditioning

## 2024-05-14 NOTE — Progress Notes (Unsigned)
 Subjective:  Patient ID: Tiffany Jarvis, female    DOB: 1951-05-28  Age: 73 y.o. MRN: [id number]  CC: The primary encounter diagnosis was Primary hypertension. Diagnoses of Pure hypercholesterolemia, Impaired fasting glucose, Other fatigue, Obstructive sleep apnea syndrome, Obesity (BMI 30-39.9), and Generalized weakness were also pertinent to this visit.   HPI Tiffany Jarvis presents for  Chief Complaint  Patient presents with   Medical Management of Chronic Issues    6 month follow up    1) HTN:  patient has had a difficult time controlling BP due to medication intolerances.  At last eval in September Bystolic  was increased to 5 mg daily , in an addition to telmisartan  80 mg (due to intolerance to maximal dose of amlodipine  ) and 5 mg amlodipine   .  BP was 136/76 in another doctor's office recently  2) GAD/depression: improved with more social activities.   pl  3) Obesity: she is exercising daily , using recumbent bike for 60 minute on days she doesn't  play pickleball.  Still having muscle cramping but not as often. Water  intake is < 60 ounces daily    Outpatient Medications Prior to Visit  Medication Sig Dispense Refill   amLODipine  (NORVASC ) 5 MG tablet Take 1 tablet (5 mg total) by mouth daily. 90 tablet 1   Calcium  Carbonate (CALCIUM  600 PO) Take 1 tablet by mouth daily.     Misc Natural Products (JOINT HEALTH PO) Take by mouth.     Multiple Vitamin (MULTI-VITAMIN DAILY PO) Multi Vitamin     Multiple Vitamins-Minerals (VITAMIN D3 COMPLETE PO) Take 2,000 Units by mouth daily.     nebivolol  (BYSTOLIC ) 5 MG tablet Take 1 tablet (5 mg total) by mouth daily. 90 tablet 1   Omega-3 Fatty Acids (FISH OIL PO) Take by mouth.     rosuvastatin  (CRESTOR ) 10 MG tablet Take 1 tablet (10 mg total) by mouth daily. 90 tablet 3   telmisartan  (MICARDIS ) 80 MG tablet Take 1 tablet (80 mg total) by mouth at bedtime. 30 tablet 0   venlafaxine  XR (EFFEXOR -XR) 150 MG 24 hr capsule Take 1 capsule (150 mg  total) by mouth daily with breakfast. 90 capsule 1   nebivolol  (BYSTOLIC ) 2.5 MG tablet Take 1 tablet (2.5 mg total) by mouth daily. 90 tablet 1   No facility-administered medications prior to visit.    Review of Systems;  Patient denies headache, fevers, malaise, unintentional weight loss, skin rash, eye pain, sinus congestion and sinus pain, sore throat, dysphagia,  hemoptysis , cough, dyspnea, wheezing, chest pain, palpitations, orthopnea, edema, abdominal pain, nausea, melena, diarrhea, constipation, flank pain, dysuria, hematuria, urinary  Frequency, nocturia, numbness, tingling, seizures,  Focal weakness, Loss of consciousness,  Tremor, insomnia, depression, anxiety, and suicidal ideation.      Objective:  BP 136/76 (Cuff Size: Normal)   Pulse (!) 56   Ht 5' 3.5 (1.613 m)   Wt 179 lb (81.2 kg)   SpO2 97%   BMI 31.21 kg/m   BP Readings from Last 3 Encounters:  05/14/24 136/76  02/18/24 (!) 144/76  01/01/24 122/80    Wt Readings from Last 3 Encounters:  05/14/24 179 lb (81.2 kg)  02/18/24 180 lb 6.4 oz (81.8 kg)  01/01/24 179 lb 9.6 oz (81.5 kg)    Physical Exam Vitals reviewed.  Constitutional:      General: She is not in acute distress.    Appearance: Normal appearance. She is normal weight. She is not ill-appearing, toxic-appearing or diaphoretic.  HENT:  Head: Normocephalic.  Eyes:     General: No scleral icterus.       Right eye: No discharge.        Left eye: No discharge.     Conjunctiva/sclera: Conjunctivae normal.  Cardiovascular:     Rate and Rhythm: Normal rate and regular rhythm.     Heart sounds: Normal heart sounds.  Pulmonary:     Effort: Pulmonary effort is normal. No respiratory distress.     Breath sounds: Normal breath sounds.  Musculoskeletal:        General: Normal range of motion.  Skin:    General: Skin is warm and dry.  Neurological:     General: No focal deficit present.     Mental Status: She is alert and oriented to person,  place, and time. Mental status is at baseline.  Psychiatric:        Mood and Affect: Mood normal.        Behavior: Behavior normal.        Thought Content: Thought content normal.        Judgment: Judgment normal.     Lab Results  Component Value Date   HGBA1C 6.0 05/14/2024   HGBA1C 5.9 11/08/2023   HGBA1C 5.9 11/07/2022    Lab Results  Component Value Date   CREATININE 0.65 05/14/2024   CREATININE 0.65 02/29/2024   CREATININE 0.61 01/01/2024    Lab Results  Component Value Date   WBC 8.2 05/14/2024   HGB 14.1 05/14/2024   HCT 41.9 05/14/2024   PLT 264.0 05/14/2024   GLUCOSE 93 05/14/2024   CHOL 187 05/14/2024   TRIG 87.0 05/14/2024   HDL 79.80 05/14/2024   LDLDIRECT 90.0 05/14/2024   LDLCALC 90 05/14/2024   ALT 31 05/14/2024   AST 23 05/14/2024   NA 139 05/14/2024   K 4.2 05/14/2024   CL 101 05/14/2024   CREATININE 0.65 05/14/2024   BUN 21 05/14/2024   CO2 30 05/14/2024   TSH 0.99 05/14/2024   INR 1.1 (H) 11/02/2021   HGBA1C 6.0 05/14/2024   MICROALBUR 1.0 05/14/2024    MM 3D SCREENING MAMMOGRAM UNILATERAL RIGHT BREAST Result Date: 05/11/2024 CLINICAL DATA:  Screening. EXAM: DIGITAL SCREENING UNILATERAL RIGHT MAMMOGRAM WITH CAD AND TOMOSYNTHESIS TECHNIQUE: Right screening digital craniocaudal and mediolateral oblique mammograms were obtained. Right screening digital breast tomosynthesis was performed. The images were evaluated with computer-aided detection. COMPARISON:  Previous exam(s). ACR Breast Density Category b: There are scattered areas of fibroglandular density. FINDINGS: The patient has had a left mastectomy. There are no findings suspicious for malignancy. IMPRESSION: No mammographic evidence of malignancy. A result letter of this screening mammogram will be mailed directly to the patient. RECOMMENDATION: Screening mammogram in one year.  (Code:SM-R-45M) BI-RADS CATEGORY  1: Negative. Electronically Signed   By: Dina  Arceo M.D.   On: 05/11/2024 16:07     Assessment & Plan:  .Primary hypertension Assessment & Plan: Given the recent normal reading  and her past intolerances to max dose amlodipine  and hydrochlorothiazide , no changes were made today   Orders: -     Comprehensive metabolic panel with GFR -     Microalbumin / creatinine urine ratio  Pure hypercholesterolemia Assessment & Plan: Tolerating better,  normal CK  Lab Results  Component Value Date   CKTOTAL 94 01/01/2024     Orders: -     Lipid panel -     LDL cholesterol, direct  Impaired fasting glucose -     Comprehensive metabolic  panel with GFR -     Hemoglobin A1c  Other fatigue -     CBC with Differential/Platelet -     TSH  Obstructive sleep apnea syndrome Assessment & Plan: Sleeping fine since receiving INSPIRE 2 years ago. Has deferred follow up sleep test    Obesity (BMI 30-39.9) Assessment & Plan: I have congratulated her in making lifetyle changes includien improved diet and regular exercise.    Generalized weakness Assessment & Plan: improving with physical conditioning    I personally spent a total of 30 minutes in the care of the patient today including {Time Based Coding:210964241}.    Follow-up: Return in about 6 months (around 11/12/2024).   Verneita LITTIE Kettering, MD

## 2024-05-14 NOTE — Assessment & Plan Note (Addendum)
 Given the recent normal reading  and her past intolerances to max dose amlodipine  and hydrochlorothiazide , no changes were made today

## 2024-05-14 NOTE — Assessment & Plan Note (Signed)
 I have congratulated her in making lifetyle changes includien improved diet and regular exercise.

## 2024-05-14 NOTE — Assessment & Plan Note (Signed)
 Tolerating better,  normal CK  Lab Results  Component Value Date   CKTOTAL 94 01/01/2024

## 2024-05-14 NOTE — Patient Instructions (Signed)
 I'm glad you are feeling well!     I have made No changes to regimen today  However if there is protein in your urine,  we will need to be more aggressive with blood pressure and I will refer you to out Hypertension clinic

## 2024-05-16 ENCOUNTER — Ambulatory Visit: Payer: Self-pay | Admitting: Internal Medicine

## 2024-06-03 NOTE — Telephone Encounter (Signed)
 open in error

## 2024-07-02 ENCOUNTER — Encounter: Payer: Self-pay | Admitting: Internal Medicine

## 2024-07-14 ENCOUNTER — Other Ambulatory Visit: Payer: Self-pay | Admitting: Internal Medicine

## 2024-07-14 DIAGNOSIS — R232 Flushing: Secondary | ICD-10-CM

## 2024-07-14 DIAGNOSIS — E7800 Pure hypercholesterolemia, unspecified: Secondary | ICD-10-CM

## 2024-07-14 DIAGNOSIS — I10 Essential (primary) hypertension: Secondary | ICD-10-CM

## 2024-07-29 ENCOUNTER — Other Ambulatory Visit: Payer: Self-pay | Admitting: Internal Medicine

## 2024-08-08 ENCOUNTER — Telehealth: Payer: Self-pay

## 2024-08-08 NOTE — Telephone Encounter (Signed)
 Left message for patient to give our office a call back to discuss Dr Lula recommendations.   OK for E2C2 to give note and scheduled patient for an appointment if patient calls back. If relayed and scheduled, please notified the office.

## 2024-08-08 NOTE — Telephone Encounter (Signed)
 Copied from CRM #[id number]. Topic: Clinical - Medication Question >> Aug 07, 2024  3:58 PM Vena HERO wrote: Reason for CRM: Pt called in because she states the last time she seen Dr Marylynn, they spoke about trying new medications/weight loss. She recently changed insurances and wanted to know what Dr Marylynn thinks about her trying Mounjaro for her high blood pressure so the insurance will cover it. A PA is required  and she provided the Pre Auth number at [phone]. She also has questions about how it would work if Dr Marylynn agrees...questions like how often do she inject, how often would she need to follow up with pcp, starting dosage etc. Please call pt to advise.

## 2024-08-08 NOTE — Telephone Encounter (Signed)
 Noted.

## 2024-08-08 NOTE — Telephone Encounter (Signed)
 Copied from CRM #[id number]. Topic: Clinical - Medication Question >> Aug 07, 2024  3:58 PM Vena HERO wrote: Reason for CRM: Pt called in because she states the last time she seen Dr Marylynn, they spoke about trying new medications/weight loss. She recently changed insurances and wanted to know what Dr Marylynn thinks about her trying Mounjaro for her high blood pressure so the insurance will cover it. A PA is required  and she provided the Pre Auth number at [phone]. She also has questions about how it would work if Dr Marylynn agrees...questions like how often do she inject, how often would she need to follow up with pcp, starting dosage etc. Please call pt to advise. >> Aug 08, 2024  9:32 AM Robinson H wrote: Patient returning call to Miyana Mordecai at office, agent relayed message and scheduled appointment for 2/24 and added to waitlist.

## 2024-08-12 ENCOUNTER — Encounter: Payer: Self-pay | Admitting: Internal Medicine

## 2024-08-15 ENCOUNTER — Telehealth: Payer: Self-pay

## 2024-08-15 MED ORDER — NEBIVOLOL HCL 5 MG PO TABS: 5.0000 mg | ORAL_TABLET | Freq: Every day | ORAL | 0 refills | Status: DC

## 2024-08-15 NOTE — Telephone Encounter (Signed)
 Copied from CRM [id number]. Topic: Clinical - Prescription Issue >> Aug 15, 2024  9:12 AM Montie POUR wrote: Reason for CRM:  Ms. Cashatt is having an issue with [facility] with medication nebivolol  (BYSTOLIC ) 5 MG tablet. [facility] is stating the refill is in process and Ms. Rottinghaus has been out of medication for 4 days. She wants to know if Dr. Marylynn could send in a order to refill to [facility] in [place], [place] (local). Please call Ms. Boyack at [phone] to let her know if this can be done. Thanks

## 2024-08-15 NOTE — Addendum Note (Signed)
 Addended by: Katja Blue on: 08/15/2024 03:17 PM   Modules accepted: Orders

## 2024-08-15 NOTE — Telephone Encounter (Signed)
 Medication has been sent to local pharmacy and pt is aware.

## 2024-08-19 ENCOUNTER — Encounter: Payer: Self-pay | Admitting: Internal Medicine

## 2024-08-19 ENCOUNTER — Ambulatory Visit: Admitting: Internal Medicine

## 2024-08-19 ENCOUNTER — Telehealth: Admitting: Internal Medicine

## 2024-08-19 DIAGNOSIS — E7800 Pure hypercholesterolemia, unspecified: Secondary | ICD-10-CM

## 2024-08-19 DIAGNOSIS — I10 Essential (primary) hypertension: Secondary | ICD-10-CM

## 2024-08-19 NOTE — Progress Notes (Unsigned)
 Patient has rescheduled.

## 2024-08-20 MED ORDER — ROSUVASTATIN CALCIUM 10 MG PO TABS: 10.0000 mg | ORAL_TABLET | Freq: Every day | ORAL | 3 refills | Status: DC

## 2024-08-20 MED ORDER — NEBIVOLOL HCL 5 MG PO TABS: 5.0000 mg | ORAL_TABLET | Freq: Every day | ORAL | 0 refills | Status: DC

## 2024-08-20 MED ORDER — AMLODIPINE BESYLATE 5 MG PO TABS: 5.0000 mg | ORAL_TABLET | Freq: Every day | ORAL | 1 refills | Status: DC

## 2024-08-21 ENCOUNTER — Ambulatory Visit: Admitting: Internal Medicine

## 2024-08-21 ENCOUNTER — Encounter: Payer: Self-pay | Admitting: Internal Medicine

## 2024-08-21 ENCOUNTER — Telehealth: Admitting: Internal Medicine

## 2024-08-21 VITALS — Ht 63.5 in | Wt 182.0 lb

## 2024-08-21 DIAGNOSIS — I10 Essential (primary) hypertension: Secondary | ICD-10-CM

## 2024-08-21 DIAGNOSIS — K7581 Nonalcoholic steatohepatitis (NASH): Secondary | ICD-10-CM

## 2024-08-21 DIAGNOSIS — E669 Obesity, unspecified: Secondary | ICD-10-CM

## 2024-08-21 DIAGNOSIS — R7303 Prediabetes: Secondary | ICD-10-CM

## 2024-08-21 DIAGNOSIS — R232 Flushing: Secondary | ICD-10-CM

## 2024-08-21 DIAGNOSIS — G4733 Obstructive sleep apnea (adult) (pediatric): Secondary | ICD-10-CM

## 2024-08-21 DIAGNOSIS — E7800 Pure hypercholesterolemia, unspecified: Secondary | ICD-10-CM

## 2024-08-21 MED ORDER — AMLODIPINE BESYLATE 5 MG PO TABS: 5.0000 mg | ORAL_TABLET | Freq: Every day | ORAL | 1 refills | Status: AC

## 2024-08-21 MED ORDER — NEBIVOLOL HCL 5 MG PO TABS: 5.0000 mg | ORAL_TABLET | Freq: Every day | ORAL | 0 refills | Status: AC

## 2024-08-21 MED ORDER — TELMISARTAN 80 MG PO TABS: 80.0000 mg | ORAL_TABLET | Freq: Every day | ORAL | 3 refills | Status: AC

## 2024-08-21 MED ORDER — VENLAFAXINE HCL ER 150 MG PO CP24: 150.0000 mg | ORAL_CAPSULE | Freq: Every day | ORAL | 1 refills | Status: AC

## 2024-08-21 MED ORDER — ROSUVASTATIN CALCIUM 10 MG PO TABS: 10.0000 mg | ORAL_TABLET | Freq: Every day | ORAL | 3 refills | Status: AC

## 2024-08-21 NOTE — Assessment & Plan Note (Addendum)
 Her  random glucose has been 116 or lower,  and  her A1c  of 6.0-  suggests she is at risk for developing diabetes.  She is following a  low glycemic index diet and particpating  regularly in an aerobic  exercises activities.  She has had recurrent symptoms of hypoglycemia after eating bran cereal for breakfast,  but not after a high protein breakfast.   She would benefit from weight loss using GLP 1 agonist.

## 2024-08-21 NOTE — Assessment & Plan Note (Addendum)
 I have congratulated her in making lifetyle changes including improved diet and regular participation in  exercise. However she is frustrated at being unable to lose weight

## 2024-08-21 NOTE — Progress Notes (Unsigned)
 Virtual Visit via Caregility   Note   This format is felt to be most appropriate for this patient at this time.  All issues noted in this document were discussed and addressed.  No physical exam was performed (except for noted visual exam findings with Video Visits).   I connected with Tiffany Jarvis on  08/21/24 at 11:30 AM EST by a video enabled telemedicine application  and verified that I am speaking with the correct person using two identifiers. Location patient: home Location provider: work or home office Persons participating in the virtual visit: patient, provider  I discussed the limitations, risks, security and privacy concerns of performing an evaluation and management service by telephone and the availability of in person appointments. I also discussed with the patient that there may be a patient responsible charge related to this service. The patient expressed understanding and agreed to proceed.   Reason for visit: obesity management   HPI:  Tiffany Jarvis is a 74 yr old female with a history of MASH, prediabetes, OSA and obesity who has been exercising 5 days per week and following a low GI diet without success in lowering her BMI.  She is motivated to try a GLP 1 agonist but is hindered by insurance non coverage.  She is frustrated at her inability to lose weight despite giving up desserts, following a careful diet,  and exercising regularly.    ROS: See pertinent positives and negatives per HPI.  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    Colon polyps    Depression    History of chicken pox    Hyperlipidemia    Hypertension    Influenza due to influenza virus, type B 08/25/2018   OSA (obstructive sleep apnea)    not able to tolerate cpap or dental mouth piece    Sleep apnea    Spinal stenosis    Vertigo    CT neck and head negative significant CAS (1 episode - yrs  ago)    Past Surgical History:  Procedure Laterality Date   BREAST BIOPSY Left 2003   positive   BREAST SURGERY  2003   L mastectomy ~2001/2002 and reconstruction   CATARACT EXTRACTION W/PHACO Left 01/28/2020   Procedure: CATARACT EXTRACTION PHACO AND INTRAOCULAR LENS PLACEMENT (IOC) LEFT;  Surgeon: Mittie Gaskin, MD;  Location: [facility];  Service: Ophthalmology;  Laterality: Left;  5.61 0:53.0 10.6%   CATARACT EXTRACTION W/PHACO Right 03/03/2020   Procedure: CATARACT EXTRACTION PHACO AND INTRAOCULAR LENS PLACEMENT (IOC) RIGHT;  Surgeon: Mittie Gaskin, MD;  Location: [facility];  Service: Ophthalmology;  Laterality: Right;  2.52 0:41.6 6.0%   CESAREAN SECTION  1979, 1982, 1992   x 3 1979, 1982, 1992    COLONOSCOPY     COLONOSCOPY WITH PROPOFOL  N/A 04/19/2020   Procedure: COLONOSCOPY WITH BIOPSY;  Surgeon: Jinny Carmine, MD;  Location: [facility];  Service: Endoscopy;  Laterality: N/A;  priority 4   DRUG INDUCED ENDOSCOPY N/A 09/22/2020   Procedure: DRUG INDUCED ENDOSCOPY;  Surgeon: Carlie Clark, MD;  Location: [facility];  Service: ENT;  Laterality: N/A;   EYE SURGERY Left 07/2019   retina hole surgery   IMPLANTATION OF HYPOGLOSSAL NERVE STIMULATOR Right 12/15/2020   Procedure: IMPLANTATION OF HYPOGLOSSAL NERVE STIMULATOR;  Surgeon: Carlie Clark, MD;  Location: [facility];  Service: ENT;  Laterality: Right;   MASTECTOMY Left 2003   OVARIAN CYST REMOVAL Right    POLYPECTOMY N/A 04/19/2020   Procedure: POLYPECTOMY;  Surgeon: Jinny Carmine, MD;  Location: [facility];  Service: Endoscopy;  Laterality: N/A;   REDUCTION MAMMAPLASTY Right    TONSILLECTOMY     TRIGGER FINGER RELEASE     TUBAL LIGATION      Family History  Problem Relation Age of Onset   Cancer Mother        colon cancer   Heart disease Father    Hypertension Father    Obesity Father    Cancer Sister        breast cancer    Breast  cancer Sister    Drug abuse Cousin    Cancer Cousin        m 1st cousin breast and pancreatic ca   Breast cancer Cousin    Cancer Cousin        breast m 1st cousin   Breast cancer Cousin    Cancer Cousin        m 1st cousin breast    Breast cancer Cousin    Cancer Cousin        m 1st cousin breast    Breast cancer Cousin     SOCIAL HX:  reports that she quit smoking about 56 years ago. Her smoking use included cigarettes. She started smoking about 59 years ago. She has a 0.8 pack-year smoking history. She has never used smokeless tobacco. She reports current alcohol use of about 1.0 standard drink of alcohol per week. She reports that she does not use drugs.   Current Medications[1]  EXAM:  VITALS per patient if applicable:  GENERAL: alert, oriented, appears well and in no acute distress  HEENT: atraumatic, conjunttiva clear, no obvious abnormalities on inspection of external nose and ears  NECK: normal movements of the head and neck  LUNGS: on inspection no signs of respiratory distress, breathing rate appears normal, no obvious gross SOB, gasping or wheezing  CV: no obvious cyanosis  MS: moves all visible extremities without noticeable abnormality  PSYCH/NEURO: pleasant and cooperative, no obvious depression or anxiety, speech and thought processing grossly intact  ASSESSMENT AND PLAN: Primary hypertension Assessment & Plan: Given the home readings of 130/80 or less and her past intolerances to maximal  dose amlodipine  and hydrochlorothiazide , no changes were made today .  Continue amlodipine  5 mg , nebivolol  5 mg and telmisartan  80 mg    Pure hypercholesterolemia -     amLODIPine  Besylate; Take 1 tablet (5 mg total) by mouth daily.  Dispense: 90 tablet; Refill: 1  Essential hypertension -     amLODIPine  Besylate; Take 1 tablet (5 mg total) by mouth daily.  Dispense: 90 tablet; Refill: 1  Hot flashes -     Venlafaxine  HCl ER; Take 1 capsule (150 mg total) by mouth  daily with breakfast.  Dispense: 90 capsule; Refill: 1  Obesity (BMI 30-39.9) Assessment & Plan: I have congratulated her in making lifetyle changes including improved diet and regular participation in  exercise. However she is frustrated at being unable to lose weight    Metabolic dysfunction-associated steatohepatitis (MASH) Assessment & Plan: Occurring in the setting of prediabetes , obesity , OSA and hypertension.SABRA   GLP 1 agonist therapy advised as she has been unable to lose weight despite a low glycemic index diet and regular exercise.    OSA (obstructive sleep apnea) Assessment & Plan: Managed effectively with implantation  of INSPIRE 2 years ago. Has deferred follow up sleep test    Prediabetes Assessment & Plan: Her  random glucose has been 116 or lower,  and  her A1c  of 6.0-  suggests she is at risk for developing diabetes.  She is following a  low glycemic index diet and particpating  regularly in an aerobic  exercises activities.  She has had recurrent symptoms of hypoglycemia after eating bran cereal for breakfast,  but not after a high protein breakfast.   She would benefit from weight loss using GLP 1 agonist.    Other orders -     Nebivolol  HCl; Take 1 tablet (5 mg total) by mouth daily.  Dispense: 30 tablet; Refill: 0 -     Rosuvastatin  Calcium ; Take 1 tablet (10 mg total) by mouth daily.  Dispense: 90 tablet; Refill: 3 -     Telmisartan ; Take 1 tablet (80 mg total) by mouth at bedtime.  Dispense: 90 tablet; Refill: 3      I discussed the assessment and treatment plan with the patient. The patient was provided an opportunity to ask questions and all were answered. The patient agreed with the plan and demonstrated an understanding of the instructions.   The patient was advised to call back or seek an in-person evaluation if the symptoms worsen or if the condition fails to improve as anticipated.   I spent 30 minutes dedicated to the care of this patient on the date  of this encounter to include pre-visit review of patient's medical history,  reviewing rior imaging studies and labs, face-to-face time with the patient , counselling on diet and exercise, and post visit ordering of testing and therapeutics.    Tiffany LITTIE Kettering, MD      [1]  Current Outpatient Medications:    Calcium  Carbonate (CALCIUM  600 PO), Take 1 tablet by mouth daily., Disp: , Rfl:    Misc Natural Products (JOINT HEALTH PO), Take by mouth., Disp: , Rfl:    Multiple Vitamin (MULTI-VITAMIN DAILY PO), Multi Vitamin, Disp: , Rfl:    Multiple Vitamins-Minerals (VITAMIN D3 COMPLETE PO), Take 2,000 Units by mouth daily., Disp: , Rfl:    Omega-3 Fatty Acids (FISH OIL PO), Take by mouth., Disp: , Rfl:    amLODipine  (NORVASC ) 5 MG tablet, Take 1 tablet (5 mg total) by mouth daily., Disp: 90 tablet, Rfl: 1   nebivolol  (BYSTOLIC ) 5 MG tablet, Take 1 tablet (5 mg total) by mouth daily., Disp: 30 tablet, Rfl: 0   rosuvastatin  (CRESTOR ) 10 MG tablet, Take 1 tablet (10 mg total) by mouth daily., Disp: 90 tablet, Rfl: 3   telmisartan  (MICARDIS ) 80 MG tablet, Take 1 tablet (80 mg total) by mouth at bedtime., Disp: 90 tablet, Rfl: 3   venlafaxine  XR (EFFEXOR -XR) 150 MG 24 hr capsule, Take 1 capsule (150 mg total) by mouth daily with breakfast., Disp: 90 capsule, Rfl: 1

## 2024-08-21 NOTE — Assessment & Plan Note (Signed)
 Given the home readings of 130/80 or less and her past intolerances to maximal  dose amlodipine  and hydrochlorothiazide , no changes were made today .  Continue amlodipine  5 mg , nebivolol  5 mg and telmisartan  80 mg

## 2024-08-21 NOTE — Assessment & Plan Note (Signed)
 Managed effectively with implantation of INSPIRE 2 years ago. Has deferred follow up sleep test

## 2024-08-21 NOTE — Assessment & Plan Note (Addendum)
 Occurring in the setting of prediabetes , obesity , OSA and hypertension.SABRA   GLP 1 agonist therapy advised as she has been unable to lose weight despite a low glycemic index diet and regular exercise.

## 2024-08-22 MED ORDER — WEGOVY 0.25 MG/0.5ML ~~LOC~~ SOAJ: 0.2500 mg | SUBCUTANEOUS | 2 refills | Status: AC

## 2024-09-02 ENCOUNTER — Ambulatory Visit: Admitting: Internal Medicine

## 2024-09-09 ENCOUNTER — Ambulatory Visit: Admitting: Internal Medicine
